# Patient Record
Sex: Female | Born: 1990
Health system: Southern US, Community
[De-identification: ages and names within clinical notes are randomized; demographics above are authoritative.]

## PROBLEM LIST (undated history)

## (undated) DIAGNOSIS — F419 Anxiety disorder, unspecified: Secondary | ICD-10-CM

## (undated) DIAGNOSIS — E063 Autoimmune thyroiditis: Secondary | ICD-10-CM

## (undated) DIAGNOSIS — E559 Vitamin D deficiency, unspecified: Secondary | ICD-10-CM

## (undated) DIAGNOSIS — F988 Other specified behavioral and emotional disorders with onset usually occurring in childhood and adolescence: Secondary | ICD-10-CM

## (undated) DIAGNOSIS — E038 Other specified hypothyroidism: Secondary | ICD-10-CM

## (undated) HISTORY — DX: Anxiety disorder, unspecified: F41.9

## (undated) HISTORY — DX: Vitamin D deficiency, unspecified: E55.9

## (undated) HISTORY — DX: Autoimmune thyroiditis: E06.3

## (undated) HISTORY — DX: Other specified behavioral and emotional disorders with onset usually occurring in childhood and adolescence: F98.8

## (undated) HISTORY — DX: Other specified hypothyroidism: E03.8

---
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
## 2014-10-14 LAB — HM PAP SMEAR: HM Pap smear: NEGATIVE

## 2015-02-17 ENCOUNTER — Other Ambulatory Visit: Payer: Self-pay

## 2015-02-17 MED ORDER — AMOXICILLIN-POT CLAVULANATE 875-125 MG PO TABS: 1.0000 | ORAL_TABLET | Freq: Two times a day (BID) | ORAL | Status: DC

## 2015-02-17 MED ORDER — AMOXICILLIN-POT CLAVULANATE 875-125 MG PO TABS
1.0000 | ORAL_TABLET | Freq: Two times a day (BID) | ORAL | Status: DC
Start: 2015-02-17 — End: 2015-06-28

## 2015-02-17 NOTE — Telephone Encounter (Signed)
OK to have abx- OK to send to her pharmacy when we know what her pharmacy is. Marland Kitchen

## 2015-02-17 NOTE — Telephone Encounter (Signed)
Patient called, she states that the swollen lymph nodes under her arm are not firm anymore, but they are still there. She now has swollen lymph nodes in her neck. She would like to know if she can have an antibiotic or if she needs to come back in for an appointment.

## 2015-02-17 NOTE — Telephone Encounter (Signed)
sent 

## 2015-02-17 NOTE — Telephone Encounter (Signed)
Called and notified patient that Dr.Johnson gave her an antibiotic. Will send this to provider so that she can send

## 2015-06-28 ENCOUNTER — Encounter: Payer: Self-pay | Admitting: Family Medicine

## 2015-06-28 ENCOUNTER — Ambulatory Visit (INDEPENDENT_AMBULATORY_CARE_PROVIDER_SITE_OTHER): Payer: Managed Care, Other (non HMO) | Admitting: Family Medicine

## 2015-06-28 ENCOUNTER — Other Ambulatory Visit: Payer: Self-pay | Admitting: Family Medicine

## 2015-06-28 VITALS — BP 124/84 | HR 68 | Temp 95.0°F | Ht 66.3 in | Wt 154.0 lb

## 2015-06-28 DIAGNOSIS — Z0000 Encounter for general adult medical examination without abnormal findings: Secondary | ICD-10-CM | POA: Diagnosis not present

## 2015-06-28 DIAGNOSIS — Z3041 Encounter for surveillance of contraceptive pills: Secondary | ICD-10-CM | POA: Diagnosis not present

## 2015-06-28 DIAGNOSIS — E039 Hypothyroidism, unspecified: Secondary | ICD-10-CM

## 2015-06-28 DIAGNOSIS — Z13220 Encounter for screening for lipoid disorders: Secondary | ICD-10-CM

## 2015-06-28 DIAGNOSIS — Z309 Encounter for contraceptive management, unspecified: Secondary | ICD-10-CM | POA: Insufficient documentation

## 2015-06-28 MED ORDER — NORGESTIMATE-ETH ESTRADIOL 0.25-35 MG-MCG PO TABS: 1.0000 | ORAL_TABLET | Freq: Every day | ORAL | Status: DC

## 2015-06-28 NOTE — Assessment & Plan Note (Signed)
Having symptoms since coming off her adderall. Checking thyroid hormones today. Will adjust dose as needed. Await results.

## 2015-06-28 NOTE — Assessment & Plan Note (Signed)
Doing well. Under good control. Pap up to date. Refill given today.

## 2015-06-28 NOTE — Progress Notes (Signed)
BP 124/84 mmHg  Pulse 68  Temp(Src) 95 F (35 C)  Ht 5' 6.3" (1.684 m)  Wt 154 lb (69.854 kg)  BMI 24.63 kg/m2  SpO2 96%  LMP 06/18/2015   Subjective:    Patient ID: Caitlin HuskEmily Molina, female    DOB: [birth year], 24 y.o.   MRN: [id number]  HPI: Caitlin Molina is a 24 y.o. female  Chief Complaint  Patient presents with  . Contraception    Needs a refill on her birth control  . Hypothyroidism    patient states that she has had some dizzy spells, she belives that her thyroid is off   HYPOTHYROIDISM- last had her thyroid checked in the spring, has been feeling dizzy and tired, hungry Thyroid control status:worse Satisfied with current treatment? no Medication side effects: no Medication compliance: excellent compliance Recent dose adjustment:no Fatigue: yes Cold intolerance: yes Heat intolerance: no Weight gain: yes Weight loss: no Constipation: yes Diarrhea/loose stools: no Palpitations: no Lower extremity edema: no Anxiety/depressed mood: yes  OCP is working well, no concerns about it. Doing well with no concerns.  Relevant past medical, surgical, family and social history reviewed and updated as indicated. Interim medical history since our last visit reviewed. Allergies and medications reviewed and updated.  Review of Systems  Constitutional: Negative.   Respiratory: Negative.   Cardiovascular: Negative.   Neurological: Negative.   Psychiatric/Behavioral: Negative.    Per HPI unless specifically indicated above     Objective:    BP 124/84 mmHg  Pulse 68  Temp(Src) 95 F (35 C)  Ht 5' 6.3" (1.684 m)  Wt 154 lb (69.854 kg)  BMI 24.63 kg/m2  SpO2 96%  LMP 06/18/2015  Wt Readings from Last 3 Encounters:  06/28/15 154 lb (69.854 kg)  02/03/15 146 lb (66.225 kg)    Physical Exam  Constitutional: She is oriented to person, place, and time. She appears well-developed and well-nourished. No distress.  HENT:  Head: Normocephalic and atraumatic.  Right Ear:  Hearing normal.  Left Ear: Hearing normal.  Nose: Nose normal.  Eyes: Conjunctivae, EOM and lids are normal. Pupils are equal, round, and reactive to light. Right eye exhibits no discharge. Left eye exhibits no discharge. No scleral icterus.  Neck: Normal range of motion. Neck supple. No JVD present. No tracheal deviation present. No thyromegaly present.  Cardiovascular: Normal rate, regular rhythm, normal heart sounds and intact distal pulses.  Exam reveals no gallop and no friction rub.   No murmur heard. Pulmonary/Chest: Effort normal and breath sounds normal. No stridor. No respiratory distress. She has no wheezes. She has no rales. She exhibits no tenderness.  Musculoskeletal: Normal range of motion.  Lymphadenopathy:    She has no cervical adenopathy.  Neurological: She is alert and oriented to person, place, and time.  Skin: Skin is warm, dry and intact. No rash noted. No erythema. No pallor.  Psychiatric: She has a normal mood and affect. Her speech is normal and behavior is normal. Judgment and thought content normal. Cognition and memory are normal.  Nursing note and vitals reviewed.  No results found for this or any previous visit.    Assessment & Plan:   Problem List Items Addressed This Visit      Endocrine   Thyroid activity decreased - Primary    Having symptoms since coming off her adderall. Checking thyroid hormones today. Will adjust dose as needed. Await results.         Other   Contraception management    Doing  well. Under good control. Pap up to date. Refill given today.           Follow up plan: Return Prior to Dec 3, for Physical/follow up thyroid.

## 2015-06-29 ENCOUNTER — Telehealth: Payer: Self-pay | Admitting: Family Medicine

## 2015-06-29 LAB — T3: T3, Total: 133 ng/dL (ref 71–180)

## 2015-06-29 LAB — T4: T4, Total: 8.4 ug/dL (ref 4.5–12.0)

## 2015-06-29 LAB — TSH: TSH: 1.55 u[IU]/mL (ref 0.450–4.500)

## 2015-06-29 NOTE — Telephone Encounter (Signed)
Called with normal results. Likely due to coming off her adderall. Checking other labs in 1 month.

## 2015-07-29 ENCOUNTER — Encounter: Payer: Self-pay | Admitting: Family Medicine

## 2015-08-08 ENCOUNTER — Encounter: Payer: Managed Care, Other (non HMO) | Admitting: Family Medicine

## 2017-03-28 ENCOUNTER — Encounter: Payer: Self-pay | Admitting: Primary Care

## 2017-03-28 ENCOUNTER — Ambulatory Visit (INDEPENDENT_AMBULATORY_CARE_PROVIDER_SITE_OTHER): Payer: 59 | Admitting: Primary Care

## 2017-03-28 VITALS — BP 122/78 | HR 76 | Temp 98.1°F | Ht 66.25 in | Wt 153.8 lb

## 2017-03-28 DIAGNOSIS — Z0000 Encounter for general adult medical examination without abnormal findings: Secondary | ICD-10-CM | POA: Diagnosis not present

## 2017-03-28 DIAGNOSIS — F909 Attention-deficit hyperactivity disorder, unspecified type: Secondary | ICD-10-CM | POA: Insufficient documentation

## 2017-03-28 DIAGNOSIS — Z113 Encounter for screening for infections with a predominantly sexual mode of transmission: Secondary | ICD-10-CM | POA: Diagnosis not present

## 2017-03-28 DIAGNOSIS — E039 Hypothyroidism, unspecified: Secondary | ICD-10-CM | POA: Diagnosis not present

## 2017-03-28 LAB — LIPID PANEL
CHOLESTEROL: 229 mg/dL — AB (ref 0–200)
HDL: 54.7 mg/dL (ref >39.00)
LDL CALC: 162 mg/dL — AB (ref 0–99)
NonHDL: 174.73
TRIGLYCERIDES: 64 mg/dL (ref 0.0–149.0)
Total CHOL/HDL Ratio: 4
VLDL: 12.8 mg/dL (ref 0.0–40.0)

## 2017-03-28 LAB — COMPREHENSIVE METABOLIC PANEL
ALBUMIN: 4.3 g/dL (ref 3.5–5.2)
ALT: 9 U/L (ref 0–35)
AST: 15 U/L (ref 0–37)
Alkaline Phosphatase: 56 U/L (ref 39–117)
BUN: 10 mg/dL (ref 6–23)
CALCIUM: 9.7 mg/dL (ref 8.4–10.5)
CHLORIDE: 102 meq/L (ref 96–112)
CO2: 27 mEq/L (ref 19–32)
CREATININE: 0.73 mg/dL (ref 0.40–1.20)
GFR: 104.5 mL/min (ref >60.00)
Glucose, Bld: 97 mg/dL (ref 70–99)
POTASSIUM: 3.8 meq/L (ref 3.5–5.1)
Sodium: 136 mEq/L (ref 135–145)
Total Bilirubin: 0.5 mg/dL (ref 0.2–1.2)
Total Protein: 7.3 g/dL (ref 6.0–8.3)

## 2017-03-28 LAB — TSH: TSH: 10.9 u[IU]/mL — ABNORMAL HIGH (ref 0.35–4.50)

## 2017-03-28 MED ORDER — LEVOTHYROXINE SODIUM 75 MCG PO TABS: ORAL_TABLET | ORAL | 1 refills | Status: DC

## 2017-03-28 NOTE — Patient Instructions (Addendum)
I sent a refill of your levothyroxine 75 mcg to your pharmacy.  Complete lab work prior to leaving today. I will notify you of your results once received.   Continue your efforts towards a healthy lifestyle through diet and exercise.  Ensure you are consuming 64 ounces of water daily.  Schedule a lab only appointment in 6 weeks to recheck your thyroid function.  It was a pleasure to meet you today! Please don't hesitate to call me with any questions. Welcome to [facility]!

## 2017-03-28 NOTE — Assessment & Plan Note (Signed)
Out of levothyroxine 75 mcg for 1 month. Check TSH today, sent refills for current levothyroxine dose to pharmacy. Will repeat TSH in 6 weeks.

## 2017-03-28 NOTE — Addendum Note (Signed)
Addended by: Alvina ChouWALSH, Kyley Laurel J on: 03/28/2017 09:01 AM   Modules accepted: Orders

## 2017-03-28 NOTE — Progress Notes (Signed)
Subjective:    Patient ID: Caitlin Molina, female    DOB: 1990/10/28, 26 y.o.   MRN: [id number]  HPI  Caitlin Molina is a 26 year old female who presents today to establish care, for complete physical, and discuss the problems mentioned below. Will obtain old records.  1) STD Testing: Diagnosed with genital warts on 03/20/17 per her GYN. They planned on treating but the treatment was out of stock at that time. She plans on returning sometime next week.   2) ADHD: Diagnosed at age 26. Currently managed on Adderall 20 mg BID. Currently following with Dr. Sharl MaKerr (psychiatry) every 6 months. She feels well managed on this medication.  3) Hypothyroidism: Currently managed on levothyroxine 75 mcg. Her last TSH was normal in January 2018. She ran out of her levothyroxine 75 mcg about 1 month ago as she couldn't get in for follow up.    Immunizations: -Tetanus: Completed in 2015 -Influenza: Completed last season   Diet: She endorses a healthy diet Breakfast: Protein shake Lunch: Protein shake Dinner: Meat, vegetables Snacks: Fruit, vegetables Desserts: Occasionally Beverages: Water, occasional coffee, occasional milk, alcohol 1-2 times weekly  Exercise: She works out at [employer] gym 30 minutes daily Eye exam: Due Dental exam: Completes annually Pap Smear: Endorses this is up-to-date. Following with GYN.   Review of Systems  Constitutional: Negative for unexpected weight change.  HENT: Negative for rhinorrhea.   Respiratory: Negative for cough and shortness of breath.   Cardiovascular: Negative for chest pain.  Gastrointestinal: Negative for constipation and diarrhea.  Genitourinary: Positive for genital sores. Negative for difficulty urinating, dysuria, frequency, menstrual problem, pelvic pain and vaginal discharge.  Musculoskeletal: Negative for arthralgias and myalgias.  Skin: Negative for rash.  Allergic/Immunologic: Negative for environmental allergies.  Neurological: Negative for  dizziness, numbness and headaches.  Psychiatric/Behavioral:       Feels well managed for ADHD per psychiatry.        Past Medical History:  Diagnosis Date  . ADD (attention deficit disorder)   . Anxiety   . Hypothyroidism due to Hashimoto's thyroiditis   . Vitamin D deficiency      Social History   Social History  . Marital status: Single    Spouse name: N/A  . Number of children: N/A  . Years of education: N/A   Occupational History  . Not on file.   Social History Main Topics  . Smoking status: Never Smoker  . Smokeless tobacco: Never Used  . Alcohol use Yes     Comment: Socially  . Drug use: No  . Sexual activity: Not Currently    Birth control/ protection: Pill   Other Topics Concern  . Not on file   Social History Narrative   Single.   No children.   Works for [employer].       Past Surgical History:  Procedure Laterality Date  . WISDOM TOOTH EXTRACTION  2013    Family History  Problem Relation Age of Onset  . Mental illness Sister   . Thyroid disease Sister   . Asthma Brother   . Heart attack Maternal Grandfather   . Thyroid disease Paternal Grandmother   . Hypertension Paternal Grandmother   . Breast cancer Paternal Aunt     No Known Allergies  Current Outpatient Prescriptions on File Prior to Visit  Medication Sig Dispense Refill  . norgestimate-ethinyl estradiol (SPRINTEC 28) 0.25-35 MG-MCG tablet Take 1 tablet by mouth daily. 1 Package 12   No current facility-administered medications  on file prior to visit.     BP 122/78   Pulse 76   Temp 98.1 F (36.7 C) (Oral)   Ht 5' 6.25" (1.683 m)   Wt 153 lb 12.8 oz (69.8 kg)   LMP 03/10/2017   SpO2 99%   BMI 24.64 kg/m    Objective:   Physical Exam  Constitutional: She is oriented to person, place, and time. She appears well-nourished.  HENT:  Right Ear: Tympanic membrane and ear canal normal.  Left Ear: Tympanic membrane and ear canal normal.  Nose: Nose normal.    Mouth/Throat: Oropharynx is clear and moist.  Eyes: Pupils are equal, round, and reactive to light. Conjunctivae and EOM are normal.  Neck: Neck supple. No thyromegaly present.  Cardiovascular: Normal rate and regular rhythm.   No murmur heard. Pulmonary/Chest: Effort normal and breath sounds normal. She has no rales.  Abdominal: Soft. Bowel sounds are normal. There is no tenderness.  Musculoskeletal: Normal range of motion.  Lymphadenopathy:    She has no cervical adenopathy.  Neurological: She is alert and oriented to person, place, and time. She has normal reflexes. No cranial nerve deficit.  Skin: Skin is warm and dry. No rash noted.  Psychiatric: She has a normal mood and affect.          Assessment & Plan:  STD Testing:  Diagnosed with genital warts per GYN on 03/20/17. Will screen for other STD's. She is asymptomatic.  Morrie Sheldon, NP

## 2017-03-28 NOTE — Assessment & Plan Note (Signed)
Immunizations UTD . Pap UTD per patient, following with GYN. Commended her on healthy diet and regular exercise. Exam unremarkable. Labs pending, including STD labs given recent diagnosis of genital warts. Follow up in 1 year for annual exam.

## 2017-03-28 NOTE — Assessment & Plan Note (Signed)
Following with psychiatry, feels well managed on Adderall 20 mg BID.

## 2017-03-29 LAB — HSV(HERPES SIMPLEX VRS) I + II AB-IGG

## 2017-03-29 LAB — GC/CHLAMYDIA PROBE AMP
CT Probe RNA: NOT DETECTED
GC Probe RNA: NOT DETECTED

## 2017-03-29 LAB — RPR

## 2017-03-29 LAB — HIV ANTIBODY (ROUTINE TESTING W REFLEX): HIV: NONREACTIVE

## 2017-04-01 LAB — HSV 1/2 AB (IGM), IFA W/RFLX TITER
HSV 1 IGM SCREEN: NEGATIVE
HSV 2 IgM Screen: NEGATIVE

## 2017-04-11 ENCOUNTER — Ambulatory Visit (INDEPENDENT_AMBULATORY_CARE_PROVIDER_SITE_OTHER): Payer: 59 | Admitting: Family

## 2017-04-11 ENCOUNTER — Ambulatory Visit (INDEPENDENT_AMBULATORY_CARE_PROVIDER_SITE_OTHER): Payer: 59

## 2017-04-11 ENCOUNTER — Encounter: Payer: Self-pay | Admitting: Family

## 2017-04-11 VITALS — BP 118/82 | HR 66 | Temp 98.3°F | Resp 16 | Ht 66.0 in | Wt 157.1 lb

## 2017-04-11 DIAGNOSIS — M79672 Pain in left foot: Secondary | ICD-10-CM | POA: Diagnosis not present

## 2017-04-11 IMAGING — DX DG FOOT COMPLETE 3+V*L*
3 series · 3 of 3 positions shown · non-contrast
Comparison: None.

CLINICAL DATA: Pain following fall

EXAM:
LEFT FOOT - COMPLETE 3+ VIEW

[foot ap]
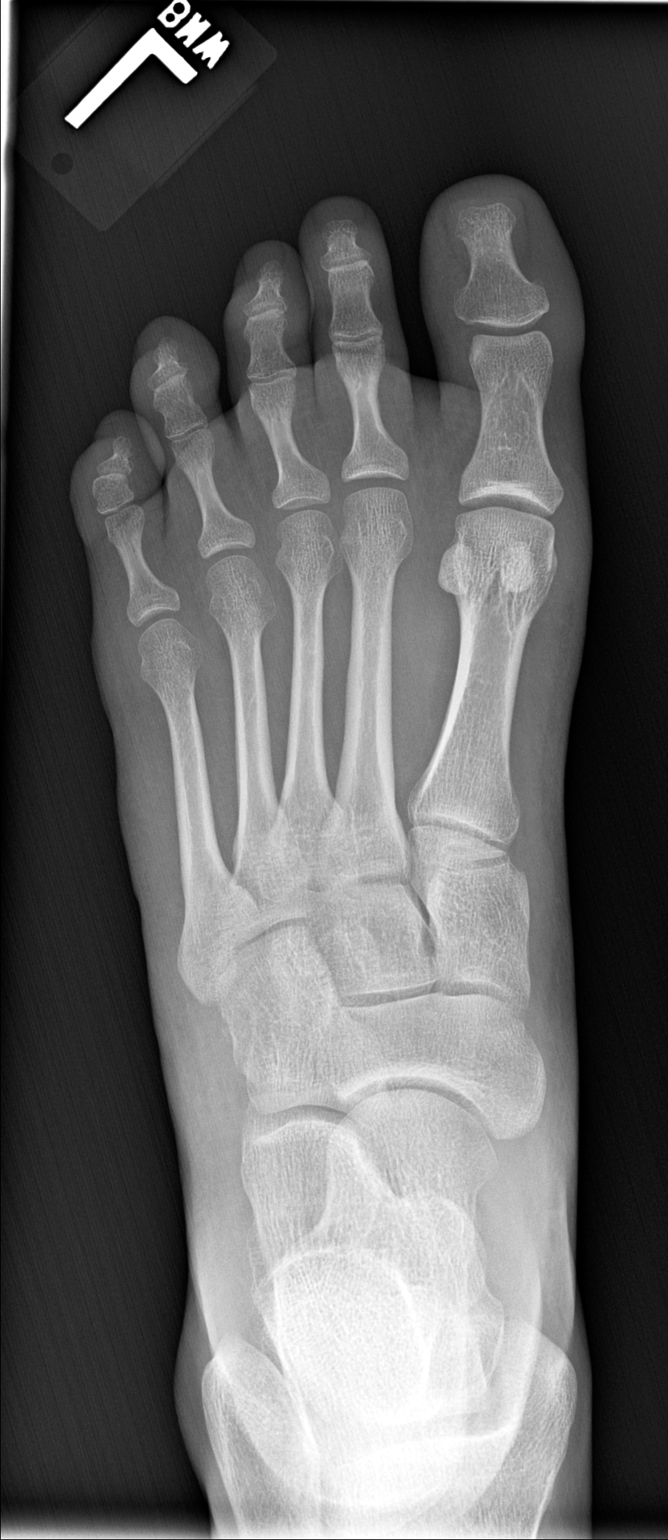

[foot obl (oblique)]
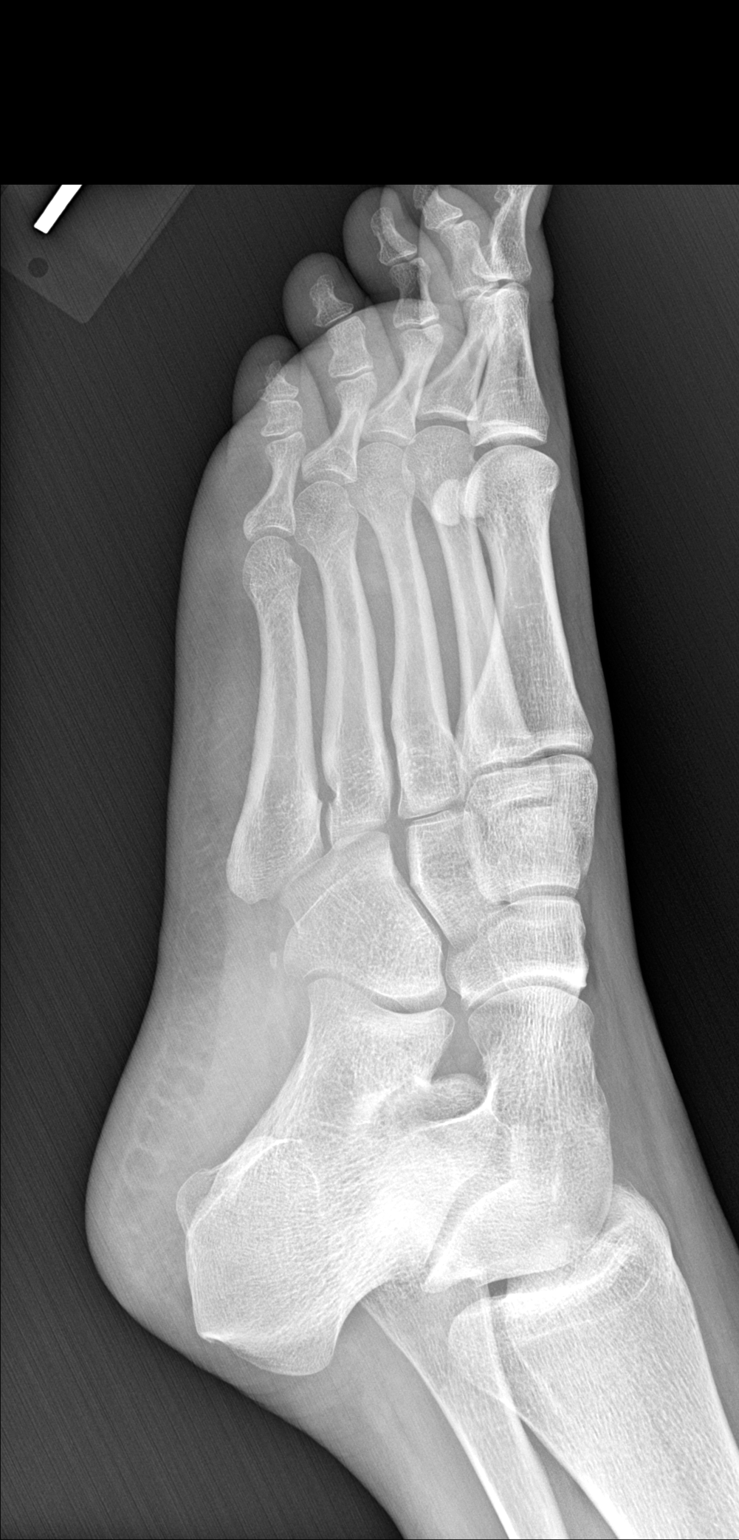

[foot lat]
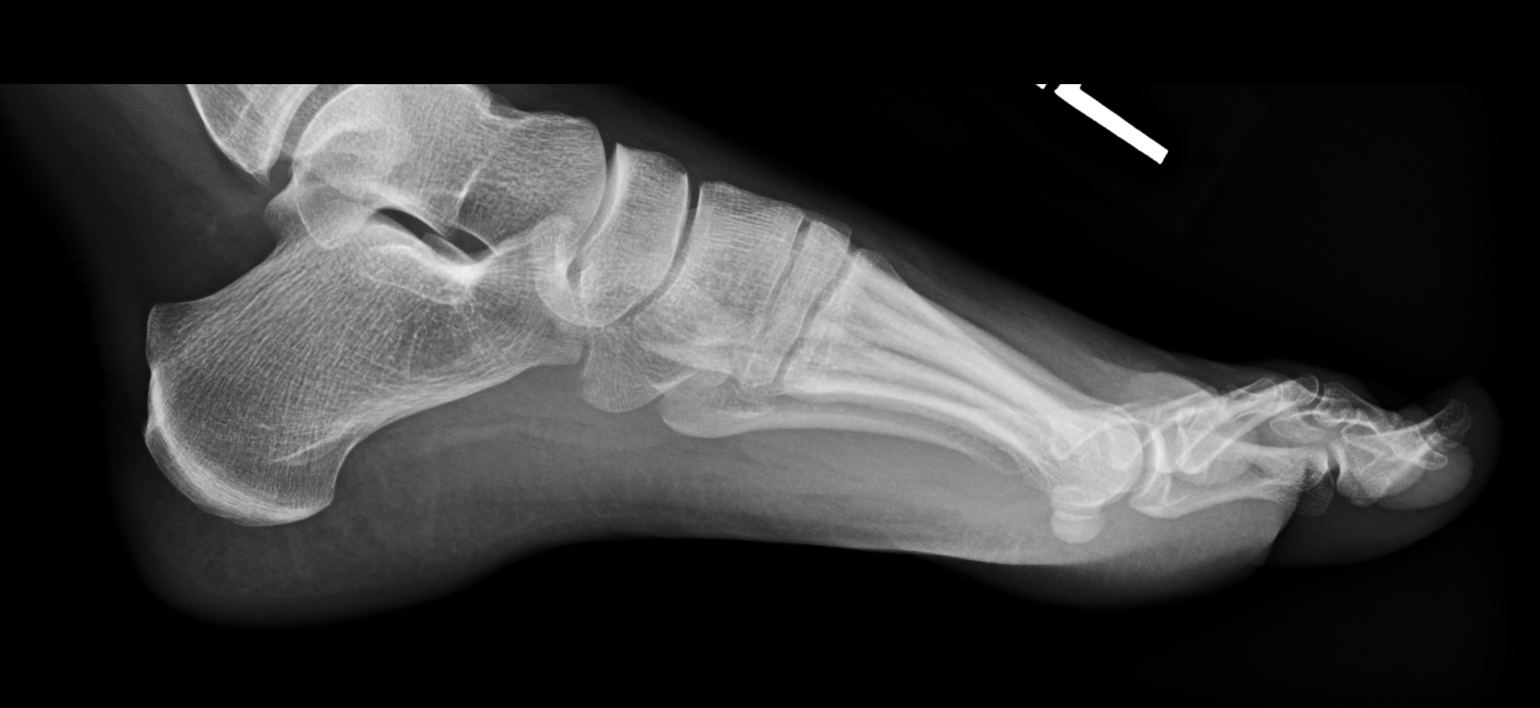

[3 of 3 positions shown; findings below may reference images not displayed]

FINDINGS: Frontal, oblique, and lateral views were obtained. There is no
fracture or spondylolisthesis. The disc spaces appear normal. No
erosive change.
IMPRESSION: No fracture or spondylolisthesis.  No evident arthropathy.

## 2017-04-11 NOTE — Patient Instructions (Signed)
Lets await xray  Continue ice, rest, compression   If not better, we will consult sports medicine  If there is no improvement in your symptoms, or if there is any worsening of symptoms, or if you have any additional concerns, please return for re-evaluation; or, if we are closed, consider going to the Emergency Room for evaluation if symptoms urgent.

## 2017-04-11 NOTE — Progress Notes (Signed)
Subjective:    Patient ID: Caitlin Molina, female    DOB: 02/08/1991, 26 y.o.   MRN: [id number]  CC: Caitlin Molina is a 26 y.o. female who presents today for an acute visit.    HPI: CC: left foot pain x 2 weeks, some improvement.  Onset after tripping on concrete. Didn't hit head or LOC.   Endorses swelling the day after and trouble bearing weight, which has improved. Able to put ' weight on it now.'   Ice, compression with some improvement.   No swelling in calf, sob No h/o dvt  Notes recent travel in airplane; on OCP  NO concerns for pregnancy.       HISTORY:  Past Medical History:  Diagnosis Date  . ADD (attention deficit disorder)   . Anxiety   . Hypothyroidism due to Hashimoto's thyroiditis   . Vitamin D deficiency    Past Surgical History:  Procedure Laterality Date  . WISDOM TOOTH EXTRACTION  2013   Family History  Problem Relation Age of Onset  . Mental illness Sister   . Thyroid disease Sister   . Asthma Brother   . Heart attack Maternal Grandfather   . Thyroid disease Paternal Grandmother   . Hypertension Paternal Grandmother   . Breast cancer Paternal Aunt     Allergies: Patient has no known allergies. Current Outpatient Prescriptions on File Prior to Visit  Medication Sig Dispense Refill  . amphetamine-dextroamphetamine (ADDERALL) 20 MG tablet Take 20 mg by mouth 2 (two) times daily.    Marland Kitchen. levothyroxine (SYNTHROID, LEVOTHROID) 75 MCG tablet Take 1 tablet by mouth every morning on an empty stomach with a full glass of water. 30 tablet 1  . norgestimate-ethinyl estradiol (SPRINTEC 28) 0.25-35 MG-MCG tablet Take 1 tablet by mouth daily. 1 Package 12   No current facility-administered medications on file prior to visit.     Social History  Substance Use Topics  . Smoking status: Never Smoker  . Smokeless tobacco: Never Used  . Alcohol use Yes     Comment: Socially    Review of Systems  Constitutional: Negative for chills and fever.    Respiratory: Negative for cough.   Cardiovascular: Negative for chest pain, palpitations and leg swelling.  Gastrointestinal: Negative for nausea and vomiting.  Musculoskeletal: Positive for joint swelling.      Objective:    BP 118/82 (BP Location: Left Arm, Patient Position: Sitting, Cuff Size: Normal)   Pulse 66   Temp 98.3 F (36.8 C) (Oral)   Resp 16   Ht 5\' 6"  (1.676 m)   Wt 157 lb 1.9 oz (71.3 kg)   LMP 04/04/2017   SpO2 99%   BMI 25.36 kg/m    Physical Exam  Constitutional: She appears well-developed and well-nourished.  Eyes: Conjunctivae are normal.  Cardiovascular: Normal rate, regular rhythm, normal heart sounds and normal pulses.   No LE edema, palpable cords or masses. No erythema or increased warmth. No asymmetry in calf size when compared bilaterally LE hair growth symmetric and present. No discoloration of varicosities noted. LE warm and palpable pedal pulses.    Pulmonary/Chest: Effort normal and breath sounds normal. She has no wheezes. She has no rhonchi. She has no rales.  Musculoskeletal:       Left ankle: She exhibits normal range of motion, no swelling and no ecchymosis. No tenderness.       Left foot: There is tenderness. There is normal range of motion, no bony tenderness, no swelling, no deformity and  no laceration.       Feet:  Area of tenderness as noted by patient. Mild tenderness with palpation.   Neurological: She is alert.  Skin: Skin is warm and dry.  Psychiatric: She has a normal mood and affect. Her speech is normal and behavior is normal. Thought content normal.  Vitals reviewed.      Assessment & Plan:   1. Left foot pain Mild tenderness with palpation, no gross deformity. Pending XR to ensure no fracture. Wells criteria for DVT low risk and no signs on exam. Advised if no fracture and she has no improvement with conservative treatment, we will consult orthopedics. Return precautions given.   - DG Foot Complete Left    I am  having Caitlin Molina maintain her norgestimate-ethinyl estradiol, amphetamine-dextroamphetamine, and levothyroxine.   No orders of the defined types were placed in this encounter.   Return precautions given.   Risks, benefits, and alternatives of the medications and treatment plan prescribed today were discussed, and patient expressed understanding.   Education regarding symptom management and diagnosis given to patient on AVS.  Continue to follow with Chestine Sporelark, Keane ScrapeKatherine K, NP for routine health maintenance.   Caitlin Molina and I agreed with plan.   Rennie PlowmanMargaret Syndey Jaskolski, FNP

## 2017-04-12 ENCOUNTER — Telehealth: Payer: Self-pay | Admitting: Primary Care

## 2017-04-12 NOTE — Telephone Encounter (Signed)
Pt called requesting xray results. Please advise, thank you!  Call pt @ [phone]

## 2017-04-12 NOTE — Telephone Encounter (Signed)
Patient was informed of normal Xray results.

## 2017-04-14 ENCOUNTER — Encounter: Payer: Self-pay | Admitting: Family

## 2017-04-18 ENCOUNTER — Telehealth: Payer: Self-pay | Admitting: Primary Care

## 2017-04-18 NOTE — Telephone Encounter (Signed)
Patient Name: Caitlin HuskMILY Wilinski  DOB: [birth year]    Initial Comment caller states recently when she coughs it feels like she is gagging on her tongue. Her Throat feels dry.    Nurse Assessment  Nurse: Nicanor Bakeosta, RN, Marylene LandAngela Date/Time (Eastern Time): 04/18/2017 3:52:12 PM  Confirm and document reason for call. If symptomatic, describe symptoms. ---Caller got a head cold on Monday. Mucous is thick but not as bad as it was on Monday. She has been hydrating the past 2 days but yesterday she started to feel like she is gagging on her tongue after coughing or sometimes after she swallows. She states the tongue does not feel different.  Does the patient have any new or worsening symptoms? ---Yes  Will a triage be completed? ---Yes  Related visit to physician within the last 2 weeks? ---No  Does the PT have any chronic conditions? (i.e. diabetes, asthma, etc.) ---No  Is the patient pregnant or possibly pregnant? (Ask all females between the ages of 3212-55) ---No  Is this a behavioral health or substance abuse call? ---No     Guidelines    Guideline Title Affirmed Question Affirmed Notes  Sinus Pain or Congestion Lots of coughing    Final Disposition User   See PCP When Office is Open (within 3 days) [place], RN, [employer]    Referrals  REFERRED TO PCP OFFICE   Disagree/Comply: Danella Maiersomply

## 2017-04-18 NOTE — Telephone Encounter (Signed)
Pt has appt to see Jae DireKate on 04/19/17 at 10:45.

## 2017-04-19 ENCOUNTER — Encounter: Payer: Self-pay | Admitting: Primary Care

## 2017-04-19 ENCOUNTER — Ambulatory Visit (INDEPENDENT_AMBULATORY_CARE_PROVIDER_SITE_OTHER): Payer: 59 | Admitting: Primary Care

## 2017-04-19 VITALS — BP 116/70 | HR 81 | Temp 98.1°F | Ht 66.25 in | Wt 153.4 lb

## 2017-04-19 DIAGNOSIS — J302 Other seasonal allergic rhinitis: Secondary | ICD-10-CM

## 2017-04-19 NOTE — Patient Instructions (Signed)
Start an antihistamine such as Allegra/Claritin/Zyrtec. Take this daily.  Refrain from using Mucinex. Send me a message through My Chart with the type you took.  Continue Ibuprofen as needed.  Nasal Congestion/Sinus Pressure: Try using Flonase (fluticasone) nasal spray. Instill 1 spray in each nostril twice daily.   It was a pleasure to see you today!

## 2017-04-19 NOTE — Progress Notes (Signed)
Subjective:    Patient ID: Caitlin Molina, female    DOB: 07-Jan-1991, 26 y.o.   MRN: [id number]  HPI  Ms. Soden is a 26 year old female who presents today with a chief complaint of cough. She also reports sore throat, nasal congestion. Her symptoms began four days ago. Yesterday she noticed her tongue swelling and was "gagging". She's taken 2 doses of Mucinex. She took a 600 mg Ibuprofen yesterday with reduction in tongue swelling. She denies changes in medications or food.   Review of Systems  Constitutional: Negative for chills, fatigue and fever.  HENT: Positive for congestion, postnasal drip and sore throat. Negative for trouble swallowing.   Respiratory: Positive for cough. Negative for shortness of breath and wheezing.   Cardiovascular: Negative for chest pain.  Neurological: Negative for headaches.       Past Medical History:  Diagnosis Date  . ADD (attention deficit disorder)   . Anxiety   . Hypothyroidism due to Hashimoto's thyroiditis   . Vitamin D deficiency      Social History   Social History  . Marital status: Single    Spouse name: N/A  . Number of children: N/A  . Years of education: N/A   Occupational History  . Not on file.   Social History Main Topics  . Smoking status: Never Smoker  . Smokeless tobacco: Never Used  . Alcohol use Yes     Comment: Socially  . Drug use: No  . Sexual activity: Not Currently    Birth control/ protection: Pill   Other Topics Concern  . Not on file   Social History Narrative   Single.   No children.   Works for [employer].       Past Surgical History:  Procedure Laterality Date  . WISDOM TOOTH EXTRACTION  2013    Family History  Problem Relation Age of Onset  . Mental illness Sister   . Thyroid disease Sister   . Asthma Brother   . Heart attack Maternal Grandfather   . Thyroid disease Paternal Grandmother   . Hypertension Paternal Grandmother   . Breast cancer Paternal Aunt     No Known  Allergies  Current Outpatient Prescriptions on File Prior to Visit  Medication Sig Dispense Refill  . levothyroxine (SYNTHROID, LEVOTHROID) 75 MCG tablet Take 1 tablet by mouth every morning on an empty stomach with a full glass of water. 30 tablet 1  . norgestimate-ethinyl estradiol (SPRINTEC 28) 0.25-35 MG-MCG tablet Take 1 tablet by mouth daily. 1 Package 12   No current facility-administered medications on file prior to visit.     BP 116/70   Pulse 81   Temp 98.1 F (36.7 C) (Oral)   Ht 5' 6.25" (1.683 m)   Wt 153 lb 6.4 oz (69.6 kg)   LMP 04/04/2017   SpO2 99%   BMI 24.57 kg/m    Objective:   Physical Exam  Constitutional: She appears well-nourished.  HENT:  Right Ear: Tympanic membrane and ear canal normal.  Left Ear: Tympanic membrane and ear canal normal.  Nose: Mucosal edema present. Right sinus exhibits no maxillary sinus tenderness and no frontal sinus tenderness. Left sinus exhibits no maxillary sinus tenderness and no frontal sinus tenderness.  Mouth/Throat: Oropharynx is clear and moist.  Tongue did not appear swollen  Eyes: Conjunctivae are normal.  Neck: Neck supple.  Cardiovascular: Normal rate and regular rhythm.   Pulmonary/Chest: Effort normal and breath sounds normal. She has no wheezes. She has  no rales.  Lymphadenopathy:    She has no cervical adenopathy.  Skin: Skin is warm and dry.          Assessment & Plan:  Allergic Rhinitis:  Nasal congestion, rhinorrhea, post nasal drip x 4 days. Exam today unremarkable.  Tongue swelling could be secondary to Mucinex as this is the only new medication she's taken. She will send us a message through My Chart with the specific type of Mucinex she took. Start Zyrtec daily for allergies and any other tongue swelling. Continue Ibuprofen PRN. Hold OTC cough suppressants until we know which Mucinex she took OTC. Stop Mucinex.  Morrie Sheldonlark,Byron Tipping Kendal, NP

## 2017-04-19 NOTE — Telephone Encounter (Signed)
Noted  

## 2017-05-02 ENCOUNTER — Other Ambulatory Visit: Payer: Self-pay | Admitting: Primary Care

## 2017-05-02 DIAGNOSIS — E039 Hypothyroidism, unspecified: Secondary | ICD-10-CM

## 2017-05-09 ENCOUNTER — Other Ambulatory Visit: Payer: 59

## 2017-05-15 ENCOUNTER — Other Ambulatory Visit: Payer: 59

## 2017-05-17 ENCOUNTER — Other Ambulatory Visit: Payer: Self-pay | Admitting: Primary Care

## 2017-05-20 ENCOUNTER — Other Ambulatory Visit: Payer: Self-pay

## 2017-06-03 ENCOUNTER — Other Ambulatory Visit: Payer: Self-pay | Admitting: Primary Care

## 2017-06-03 DIAGNOSIS — E039 Hypothyroidism, unspecified: Secondary | ICD-10-CM

## 2017-08-10 ENCOUNTER — Other Ambulatory Visit: Payer: Self-pay | Admitting: Primary Care

## 2017-08-10 DIAGNOSIS — E039 Hypothyroidism, unspecified: Secondary | ICD-10-CM

## 2017-09-05 DIAGNOSIS — Z3A13 13 weeks gestation of pregnancy: Secondary | ICD-10-CM | POA: Diagnosis not present

## 2017-09-05 DIAGNOSIS — O283 Abnormal ultrasonic finding on antenatal screening of mother: Secondary | ICD-10-CM | POA: Diagnosis not present

## 2017-09-11 ENCOUNTER — Other Ambulatory Visit: Payer: Self-pay | Admitting: Primary Care

## 2017-09-11 DIAGNOSIS — E039 Hypothyroidism, unspecified: Secondary | ICD-10-CM

## 2017-09-12 DIAGNOSIS — Z3492 Encounter for supervision of normal pregnancy, unspecified, second trimester: Secondary | ICD-10-CM | POA: Insufficient documentation

## 2017-09-14 DIAGNOSIS — O161 Unspecified maternal hypertension, first trimester: Secondary | ICD-10-CM | POA: Insufficient documentation

## 2017-10-17 ENCOUNTER — Encounter (HOSPITAL_COMMUNITY): Payer: Self-pay

## 2018-01-28 ENCOUNTER — Telehealth: Payer: Self-pay | Admitting: Primary Care

## 2018-01-28 NOTE — Telephone Encounter (Signed)
Okay to schedule at her convenience 

## 2018-01-28 NOTE — Telephone Encounter (Signed)
Copied from CRM [id number]. Topic: Appointment Scheduling - Scheduling Inquiry for Clinic >> Jan 28, 2018  3:55 PM Landry Mellow wrote: Reason for CRM: pt would like to have tdap booster  Cb is [phone] Pt is [redacted] weeks pregnant

## 2018-02-05 ENCOUNTER — Ambulatory Visit: Payer: Self-pay

## 2018-02-06 ENCOUNTER — Ambulatory Visit (INDEPENDENT_AMBULATORY_CARE_PROVIDER_SITE_OTHER): Payer: Medicaid Other

## 2018-02-06 DIAGNOSIS — Z23 Encounter for immunization: Secondary | ICD-10-CM

## 2018-03-04 ENCOUNTER — Other Ambulatory Visit (HOSPITAL_COMMUNITY): Payer: Self-pay

## 2018-03-04 DIAGNOSIS — Z3A40 40 weeks gestation of pregnancy: Secondary | ICD-10-CM

## 2018-03-06 ENCOUNTER — Encounter (HOSPITAL_COMMUNITY): Payer: Self-pay | Admitting: *Deleted

## 2018-03-10 ENCOUNTER — Other Ambulatory Visit: Payer: Self-pay

## 2018-03-10 ENCOUNTER — Other Ambulatory Visit (HOSPITAL_COMMUNITY): Payer: Self-pay

## 2018-03-10 ENCOUNTER — Encounter (HOSPITAL_COMMUNITY): Payer: Self-pay

## 2018-03-10 ENCOUNTER — Ambulatory Visit (HOSPITAL_COMMUNITY)
Admission: RE | Admit: 2018-03-10 | Discharge: 2018-03-10 | Disposition: A | Discharge status: Home / Self Care | Payer: Medicaid Other | Source: Ambulatory Visit

## 2018-03-10 DIAGNOSIS — E039 Hypothyroidism, unspecified: Secondary | ICD-10-CM

## 2018-03-10 DIAGNOSIS — Z3A40 40 weeks gestation of pregnancy: Secondary | ICD-10-CM | POA: Diagnosis not present

## 2018-03-10 DIAGNOSIS — Z363 Encounter for antenatal screening for malformations: Secondary | ICD-10-CM | POA: Insufficient documentation

## 2018-03-10 DIAGNOSIS — Z3A39 39 weeks gestation of pregnancy: Secondary | ICD-10-CM | POA: Diagnosis not present

## 2018-03-10 DIAGNOSIS — O365930 Maternal care for other known or suspected poor fetal growth, third trimester, not applicable or unspecified: Secondary | ICD-10-CM | POA: Insufficient documentation

## 2018-03-10 DIAGNOSIS — O26843 Uterine size-date discrepancy, third trimester: Secondary | ICD-10-CM | POA: Diagnosis not present

## 2018-03-10 DIAGNOSIS — O99283 Endocrine, nutritional and metabolic diseases complicating pregnancy, third trimester: Secondary | ICD-10-CM | POA: Diagnosis not present

## 2018-03-11 DIAGNOSIS — Z3483 Encounter for supervision of other normal pregnancy, third trimester: Secondary | ICD-10-CM | POA: Diagnosis not present

## 2018-03-11 DIAGNOSIS — Z3A39 39 weeks gestation of pregnancy: Secondary | ICD-10-CM | POA: Diagnosis not present

## 2018-03-12 ENCOUNTER — Inpatient Hospital Stay (HOSPITAL_COMMUNITY): Admit: 2018-03-12 | Payer: Self-pay

## 2018-03-16 DIAGNOSIS — O9279 Other disorders of lactation: Secondary | ICD-10-CM | POA: Diagnosis not present

## 2018-03-19 DIAGNOSIS — O9279 Other disorders of lactation: Secondary | ICD-10-CM | POA: Diagnosis not present

## 2018-03-24 ENCOUNTER — Encounter: Payer: Self-pay | Admitting: Family Medicine

## 2018-03-24 ENCOUNTER — Ambulatory Visit: Payer: Medicaid Other | Admitting: Family Medicine

## 2018-03-24 VITALS — BP 108/76 | HR 74 | Temp 97.9°F | Ht 66.25 in | Wt 178.5 lb

## 2018-03-24 DIAGNOSIS — N6311 Unspecified lump in the right breast, upper outer quadrant: Secondary | ICD-10-CM

## 2018-03-24 DIAGNOSIS — N6310 Unspecified lump in the right breast, unspecified quadrant: Secondary | ICD-10-CM

## 2018-03-24 NOTE — Progress Notes (Signed)
   Subjective:    Patient ID: Caitlin Molina, female    DOB: 1991/06/03, 27 y.o.   MRN: [id number]  HPI This is a 27 yo female, accompanied by her mother, who presents today with right breast mass that she noticed about a week ago, noticed it after her milk came in. Not painful unless she is engorged, no fevers. Saw her midwife who thought it was likely a cyst, but not related to lactation. No family history of breast cancer.  She had a baby girl 9 days ago. Has been doing well. Breast feeding going well.   Past Medical History:  Diagnosis Date  . ADD (attention deficit disorder)   . Anxiety   . Hypothyroidism due to Hashimoto's thyroiditis   . Vitamin D deficiency    Past Surgical History:  Procedure Laterality Date  . WISDOM TOOTH EXTRACTION  2013   Family History  Problem Relation Age of Onset  . Mental illness Sister   . Thyroid disease Sister   . Asthma Brother   . Heart attack Maternal Grandfather   . Thyroid disease Paternal Grandmother   . Hypertension Paternal Grandmother   . Breast cancer Paternal Aunt    Social History   Tobacco Use  . Smoking status: Never Smoker  . Smokeless tobacco: Never Used  Substance Use Topics  . Alcohol use: Yes    Comment: Socially  . Drug use: No      Review of Systems Per HPI    Objective:   Physical Exam  Constitutional: She appears well-developed and well-nourished. No distress.  HENT:  Head: Normocephalic and atraumatic.  Eyes: Conjunctivae are normal.  Cardiovascular: Normal rate.  Pulmonary/Chest: Effort normal. Right breast exhibits no inverted nipple, no skin change and no tenderness. Left breast exhibits no inverted nipple, no mass, no skin change and no tenderness.    Neurological: She is alert.  Skin: Skin is warm and dry. She is not diaphoretic.  Psychiatric: She has a normal mood and affect. Her behavior is normal. Judgment and thought content normal.  Vitals reviewed.     BP 108/76 (BP Location: Left  Arm, Patient Position: Sitting, Cuff Size: Normal)   Pulse 74   Temp 97.9 F (36.6 C) (Oral)   Ht 5' 6.25" (1.683 m)   Wt 178 lb 8 oz (81 kg)   LMP 06/05/2017   SpO2 98%   Breastfeeding? Yes   BMI 28.59 kg/m      Assessment & Plan:  1. Breast mass, right - low suspicion for worrisome lesion, but will obtain US - US BREAST LTD UNI RIGHT INC AXILLA; Future   Olean Reeeborah Xylia Scherger, FNP-BC  [facility]  03/24/2018 11:22 AM

## 2018-03-24 NOTE — Patient Instructions (Signed)
Good to see you today  Please stop at the front desk to schedule your ultrasound

## 2018-03-25 ENCOUNTER — Ambulatory Visit
Admission: RE | Admit: 2018-03-25 | Discharge: 2018-03-25 | Disposition: A | Discharge status: Home / Self Care | Payer: Medicaid Other | Source: Ambulatory Visit | Attending: Family Medicine | Admitting: Family Medicine

## 2018-03-25 DIAGNOSIS — N6311 Unspecified lump in the right breast, upper outer quadrant: Secondary | ICD-10-CM | POA: Diagnosis present

## 2018-03-25 DIAGNOSIS — N6310 Unspecified lump in the right breast, unspecified quadrant: Secondary | ICD-10-CM | POA: Diagnosis not present

## 2018-03-25 DIAGNOSIS — N6001 Solitary cyst of right breast: Secondary | ICD-10-CM | POA: Diagnosis not present

## 2018-03-25 DIAGNOSIS — N6011 Diffuse cystic mastopathy of right breast: Secondary | ICD-10-CM | POA: Diagnosis not present

## 2018-04-10 DIAGNOSIS — O9279 Other disorders of lactation: Secondary | ICD-10-CM | POA: Diagnosis not present

## 2018-10-25 DIAGNOSIS — S46001A Unspecified injury of muscle(s) and tendon(s) of the rotator cuff of right shoulder, initial encounter: Secondary | ICD-10-CM | POA: Diagnosis not present

## 2018-10-25 DIAGNOSIS — M62838 Other muscle spasm: Secondary | ICD-10-CM | POA: Diagnosis not present

## 2018-10-25 DIAGNOSIS — M25511 Pain in right shoulder: Secondary | ICD-10-CM | POA: Diagnosis not present

## 2018-10-30 DIAGNOSIS — S161XXA Strain of muscle, fascia and tendon at neck level, initial encounter: Secondary | ICD-10-CM | POA: Insufficient documentation

## 2019-11-19 ENCOUNTER — Other Ambulatory Visit: Payer: Medicaid Other

## 2019-11-19 ENCOUNTER — Encounter: Payer: Self-pay | Admitting: Family Medicine

## 2019-11-19 ENCOUNTER — Ambulatory Visit (INDEPENDENT_AMBULATORY_CARE_PROVIDER_SITE_OTHER): Payer: Medicaid Other | Admitting: Family Medicine

## 2019-11-19 DIAGNOSIS — J069 Acute upper respiratory infection, unspecified: Secondary | ICD-10-CM

## 2019-11-19 DIAGNOSIS — Z20828 Contact with and (suspected) exposure to other viral communicable diseases: Secondary | ICD-10-CM | POA: Diagnosis not present

## 2019-11-19 MED ORDER — BENZONATATE 200 MG PO CAPS: 200.0000 mg | ORAL_CAPSULE | Freq: Three times a day (TID) | ORAL | 0 refills | Status: DC | PRN

## 2019-11-19 NOTE — Progress Notes (Signed)
Virtual Visit via Video Note  I connected with Caitlin Molina on 11/19/19 at  9:00 AM EST by a video enabled telemedicine application and verified that I am speaking with the correct person using two identifiers.  Location: Patient: working from home  Provider: office   I discussed the limitations of evaluation and management by telemedicine and the availability of in person appointments. The patient expressed understanding and agreed to proceed.  Parties involved in encounter  Patient: Caitlin Molina  Provider:  Loura Pardon MD    History of Present Illness: 29 yo pt of NP Caitlin Molina presents with cough and congestion   She has been congested since Sunday  Started with a mild ST and some pnd  This became worse  Then started cough a few days ago - getting worse   No fever  Headache off/on -- sinus/face with pressure  Nasal d/c is clear so far  Ears do not hurt   Non productive cough  occ little wheeze -mild  occ very mildly sob- with cough spell   No loss of taste or smell   A little nausea w/o vomiting  No diarrhea   No known exp to covid 19 that she knows of   Otc: Taking zicam cold remedy  Has taken nyquil at night - it helps a little   Daughter was sick lately - had a fever/ then cough  Rapid test was negative   Patient Active Problem List   Diagnosis Date Noted  . Viral URI with cough 11/19/2019  . Elevated blood pressure affecting pregnancy in first trimester, antepartum 09/14/2017  . Preventative health care 03/28/2017  . ADHD 03/28/2017  . Hypothyroidism 06/28/2015  . Contraception management 06/28/2015   Past Medical History:  Diagnosis Date  . ADD (attention deficit disorder)   . Anxiety   . Hypothyroidism due to Hashimoto's thyroiditis   . Vitamin D deficiency    Past Surgical History:  Procedure Laterality Date  . WISDOM TOOTH EXTRACTION  2013   Social History   Tobacco Use  . Smoking status: Never Smoker  . Smokeless tobacco: Never Used   Substance Use Topics  . Alcohol use: Yes    Comment: Socially  . Drug use: No   Family History  Problem Relation Age of Onset  . Mental illness Sister   . Thyroid disease Sister   . Asthma Brother   . Heart attack Maternal Grandfather   . Thyroid disease Paternal Grandmother   . Hypertension Paternal Grandmother   . Breast cancer Paternal Aunt    Allergies  Allergen Reactions  . Ciprofloxacin     Family allergy - pt never exposed  . Levofloxacin     Family allergies   Current Outpatient Medications on File Prior to Visit  Medication Sig Dispense Refill  . amphetamine-dextroamphetamine (ADDERALL) 30 MG tablet Take 1 tablet by mouth 2 (two) times daily.  0   No current facility-administered medications on file prior to visit.     Review of Systems  Constitutional: Negative for chills, fever and malaise/fatigue.  HENT: Positive for congestion. Negative for ear pain, sinus pain and sore throat.   Eyes: Negative for blurred vision, discharge and redness.  Respiratory: Positive for cough and wheezing. Negative for sputum production, shortness of breath and stridor.   Cardiovascular: Negative for chest pain, palpitations and leg swelling.  Gastrointestinal: Negative for abdominal pain, diarrhea, nausea and vomiting.  Musculoskeletal: Negative for myalgias.  Skin: Negative for rash.  Neurological: Positive for headaches. Negative  for dizziness.      Observations/Objective: Patient appears well, in no distress Weight is baseline  No facial swelling or asymmetry Mildly hoarse voice  Sounds congested/nasal  No obvious tremor or mobility impairment Moving neck and UEs normally Able to hear the call well  No cough or shortness of breath during interview  Talkative and mentally sharp with no cognitive changes No skin changes on face or neck , no rash or pallor Affect is normal    Assessment and Plan: Problem List Items Addressed This Visit      Respiratory   Viral URI  with cough    With congestion and cough and headache  Adv to get tested for covid and info given [facility]  inst her to call us with results  Adv sympt care at home (delsym/ expectorant/nasal saline/tylenol prn)  Tessalon send for cough as well inst to update if symptoms worsen (go to ER if severe) and if she develops new symptoms like fever            Follow Up Instructions: Drink fluids and rest  Get tested for covid, call us when you get a result and isolate until then  For cough- you can try delsym I also sent in tessalon that you can take 3 times daily   An expectorant may help congestion along with nasal saline spray  Tylenol for fever or headache  For night time-nyquil is ok if helpful/ do not mix with other over the counter medications Update if not starting to improve in a week or if worsening  Also if you develop new symptoms like fever  If severe symptoms-please go to the ER   I discussed the assessment and treatment plan with the patient. The patient was provided an opportunity to ask questions and all were answered. The patient agreed with the plan and demonstrated an understanding of the instructions.   The patient was advised to call back or seek an in-person evaluation if the symptoms worsen or if the condition fails to improve as anticipated.     Roxy Manns, MD

## 2019-11-19 NOTE — Patient Instructions (Addendum)
Drink fluids and rest  Get tested for covid, call us when you get a result and isolate until then  For cough- you can try delsym I also sent in tessalon that you can take 3 times daily   An expectorant may help congestion along with nasal saline spray  Tylenol for fever or headache  For night time-nyquil is ok if helpful/ do not mix with other over the counter medications Update if not starting to improve in a week or if worsening  Also if you develop new symptoms like fever  If severe symptoms-please go to the ER

## 2019-11-19 NOTE — Assessment & Plan Note (Addendum)
With congestion and cough and headache  Adv to get tested for covid and info given to [facility] tested [facility]  inst her to call us with results  Adv sympt care at home (delsym/ expectorant/nasal saline/tylenol prn)  Tessalon send for cough as well inst to update if symptoms worsen (go to ER if severe) and if she develops new symptoms like fever

## 2019-12-17 DIAGNOSIS — Z03818 Encounter for observation for suspected exposure to other biological agents ruled out: Secondary | ICD-10-CM | POA: Diagnosis not present

## 2019-12-17 DIAGNOSIS — Z20828 Contact with and (suspected) exposure to other viral communicable diseases: Secondary | ICD-10-CM | POA: Diagnosis not present

## 2020-04-12 ENCOUNTER — Other Ambulatory Visit: Payer: Self-pay

## 2020-04-12 ENCOUNTER — Ambulatory Visit (INDEPENDENT_AMBULATORY_CARE_PROVIDER_SITE_OTHER)
Admission: RE | Admit: 2020-04-12 | Discharge: 2020-04-12 | Disposition: A | Discharge status: Home / Self Care | Payer: Medicaid Other | Source: Ambulatory Visit | Attending: Family Medicine | Admitting: Family Medicine

## 2020-04-12 ENCOUNTER — Encounter: Payer: Self-pay | Admitting: Family Medicine

## 2020-04-12 ENCOUNTER — Ambulatory Visit (INDEPENDENT_AMBULATORY_CARE_PROVIDER_SITE_OTHER): Payer: Medicaid Other | Admitting: Family Medicine

## 2020-04-12 DIAGNOSIS — S92919A Unspecified fracture of unspecified toe(s), initial encounter for closed fracture: Secondary | ICD-10-CM | POA: Insufficient documentation

## 2020-04-12 DIAGNOSIS — M79671 Pain in right foot: Secondary | ICD-10-CM | POA: Diagnosis not present

## 2020-04-12 DIAGNOSIS — S92514A Nondisplaced fracture of proximal phalanx of right lesser toe(s), initial encounter for closed fracture: Secondary | ICD-10-CM | POA: Diagnosis not present

## 2020-04-12 NOTE — Progress Notes (Signed)
Subjective:    Patient ID: Caitlin Molina, female    DOB: 1991-05-04, 29 y.o.   MRN: [id number]  This visit occurred during the SARS-CoV-2 public health emergency.  Safety protocols were in place, including screening questions prior to the visit, additional usage of staff PPE, and extensive cleaning of exam room while observing appropriate contact time as indicated for disinfecting solutions.    HPI 29 yo pt of NP Clark presents with R foot injury   Pt injured her foot after surfing  Got out of the water and hit her foot (pinkie toe) on the surfboard Hurt a lot  Jammed it? Possibly  Started swelling an hour later and more painful  It did turn black and blue as well   Hard to walk on that foot  She wore open toe shoes to avoid pressure on the toe  Some improvement now but not at 100%   She used ice on it  Did not take any otc pain medication    Rest of foot feels ok   Has a 27 year old- cannot run after her    DG Foot Complete Right  Result Date: 04/12/2020 CLINICAL DATA:  29 year old female with trauma to the right foot. EXAM: RIGHT FOOT COMPLETE - 3+ VIEW COMPARISON:  None. FINDINGS: There is a nondisplaced fracture of the proximal phalanx of the fifth digit. No other acute fracture. There is no dislocation. The bones are well mineralized. No arthritic changes. The soft tissues are grossly unremarkable. IMPRESSION: Nondisplaced fracture of the proximal phalanx of the fifth digit. Electronically Signed   By: Elgie Collard M.D.   On: 04/12/2020 17:17   Patient Active Problem List   Diagnosis Date Noted  . Right foot pain 04/12/2020  . Elevated blood pressure affecting pregnancy in first trimester, antepartum 09/14/2017  . Preventative health care 03/28/2017  . ADHD 03/28/2017  . Hypothyroidism 06/28/2015  . Contraception management 06/28/2015   Past Medical History:  Diagnosis Date  . ADD (attention deficit disorder)   . Anxiety   . Hypothyroidism due to Hashimoto's  thyroiditis   . Vitamin D deficiency    Past Surgical History:  Procedure Laterality Date  . WISDOM TOOTH EXTRACTION  2013   Social History   Tobacco Use  . Smoking status: Never Smoker  . Smokeless tobacco: Never Used  Vaping Use  . Vaping Use: Never used  Substance Use Topics  . Alcohol use: Yes    Comment: Socially  . Drug use: No   Family History  Problem Relation Age of Onset  . Mental illness Sister   . Thyroid disease Sister   . Asthma Brother   . Heart attack Maternal Grandfather   . Thyroid disease Paternal Grandmother   . Hypertension Paternal Grandmother   . Breast cancer Paternal Aunt    Allergies  Allergen Reactions  . Ciprofloxacin     Family allergy - pt never exposed  . Levofloxacin     Family allergies   Current Outpatient Medications on File Prior to Visit  Medication Sig Dispense Refill  . amphetamine-dextroamphetamine (ADDERALL XR) 20 MG 24 hr capsule Take by mouth every morning.    Marland Kitchen amphetamine-dextroamphetamine (ADDERALL) 30 MG tablet Take 15 mg by mouth daily. Early morning  0   No current facility-administered medications on file prior to visit.     Review of Systems  Constitutional: Negative for activity change, appetite change, fatigue, fever and unexpected weight change.  HENT: Negative for congestion, ear pain, rhinorrhea,  sinus pressure and sore throat.   Eyes: Negative for pain, redness and visual disturbance.  Respiratory: Negative for cough, shortness of breath and wheezing.   Cardiovascular: Negative for chest pain and palpitations.  Gastrointestinal: Negative for abdominal pain, blood in stool, constipation and diarrhea.  Endocrine: Negative for polydipsia and polyuria.  Genitourinary: Negative for dysuria, frequency and urgency.  Musculoskeletal: Negative for arthralgias, back pain and myalgias.       R foot 5th toe pain and swelling  Skin: Negative for pallor and rash.  Allergic/Immunologic: Negative for environmental  allergies.  Neurological: Negative for dizziness, syncope and headaches.  Hematological: Negative for adenopathy. Does not bruise/bleed easily.  Psychiatric/Behavioral: Negative for decreased concentration and dysphoric mood. The patient is not nervous/anxious.        Objective:   Physical Exam Constitutional:      General: She is not in acute distress.    Appearance: Normal appearance. She is normal weight. She is not ill-appearing.  Eyes:     Conjunctiva/sclera: Conjunctivae normal.     Pupils: Pupils are equal, round, and reactive to light.  Musculoskeletal:     Comments: R 5th toe is swollen and tender / slightly bruised  Nail intact  No skin interruption  Pt can bear wt with a limp  Pain to flex more than extend  Nl perfusion/appearance of foot and ankle  Skin:    General: Skin is warm and dry.     Findings: No bruising, erythema or rash.  Neurological:     Mental Status: She is alert.           Assessment & Plan:   Problem List Items Addressed This Visit      Musculoskeletal and Integument   Toe fracture    R 5th toe fracture from injury 10 d ago -non displaced Has improved clinically with buddy taping Given post op shoe today- makes walking more comfortable Recommend ice /elevation Aleve prn with food  Update if not starting to improve in a week or if worsening

## 2020-04-12 NOTE — Assessment & Plan Note (Signed)
R 5th toe fracture from injury 10 d ago -non displaced Has improved clinically with buddy taping Given post op shoe today- makes walking more comfortable Recommend ice /elevation Aleve prn with food  Update if not starting to improve in a week or if worsening

## 2020-04-12 NOTE — Patient Instructions (Signed)
Wear the orthopedic shoe as needed when active  You can still buddy tape the toe for compression/support  Ice is helpful also   For pain relief - aleve 1-2 pills with food every 12 hours as needed  This may help swelling also  Elevate your foot when you sit    Xray now - we will contact you with a result tomorrow

## 2020-07-18 ENCOUNTER — Other Ambulatory Visit: Payer: Self-pay | Admitting: Primary Care

## 2020-07-18 ENCOUNTER — Other Ambulatory Visit (INDEPENDENT_AMBULATORY_CARE_PROVIDER_SITE_OTHER): Payer: Medicaid Other

## 2020-07-18 ENCOUNTER — Telehealth (INDEPENDENT_AMBULATORY_CARE_PROVIDER_SITE_OTHER): Payer: Medicaid Other | Admitting: Primary Care

## 2020-07-18 DIAGNOSIS — J029 Acute pharyngitis, unspecified: Secondary | ICD-10-CM | POA: Diagnosis not present

## 2020-07-18 LAB — POCT RAPID STREP A (OFFICE): Rapid Strep A Screen: NEGATIVE

## 2020-07-18 NOTE — Progress Notes (Signed)
Subjective:    Patient ID: Caitlin Molina, female    DOB: 1990-11-22, 29 y.o.   MRN: [id number]  HPI  Virtual Visit via Video Note  I connected with Caitlin Molina on 07/18/20 at 10:20 AM EST by a video enabled telemedicine application and verified that I am speaking with the correct person using two identifiers.  Location: Patient: Home Provider: Office Participants: Patient and myself   I discussed the limitations of evaluation and management by telemedicine and the availability of in person appointments. The patient expressed understanding and agreed to proceed.  History of Present Illness:  Ms. Caitlin Molina is a 29 year old female who presents today with a chief complaint of sore throat.  Also with dry/wet cough that is overall infrequent., painful swallowing. Symptoms began about three days ago with sore throat, progressing now with painful swallowing.   She denies loss of taste/smell, diarrhea, headaches, known exposure to Covid-19. She is fully vaccinated against Covid-19. She's taken Nyqil and Cold Eaze last night and today.   Her daughter had pink eye last week, now has a rash to the extremities. Her mother is now developing symptoms of sore throat as of today.    Observations/Objective:  Alert and oriented. Appears well, not sickly. No distress. Speaking in complete sentences. No cough during visit.   Assessment and Plan:  Acute URI symptoms x 3 days, now with painful swallowing. Will need to check rapid strep and Covid-19. She will come in for those tests this afternoon. Continue conservative treatment for now.  Follow Up Instructions:  We will contact you regarding your tests for this afternoon.  It was a pleasure to see you today! [name], NP-C    I discussed the assessment and treatment plan with the patient. The patient was provided an opportunity to ask questions and all were answered. The patient agreed with the plan and demonstrated an understanding of  the instructions.   The patient was advised to call back or seek an in-person evaluation if the symptoms worsen or if the condition fails to improve as anticipated.    [name], NP    Review of Systems  Constitutional: Negative for chills, fatigue and fever.  HENT: Positive for sore throat. Negative for congestion.   Respiratory: Positive for cough.   Gastrointestinal: Negative for diarrhea.  Neurological: Negative for headaches.       Past Medical History:  Diagnosis Date  . ADD (attention deficit disorder)   . Anxiety   . Hypothyroidism due to Hashimoto's thyroiditis   . Vitamin D deficiency      Social History   Socioeconomic History  . Marital status: Single    Spouse name: Not on file  . Number of children: Not on file  . Years of education: Not on file  . Highest education level: Not on file  Occupational History  . Not on file  Tobacco Use  . Smoking status: Never Smoker  . Smokeless tobacco: Never Used  Vaping Use  . Vaping Use: Never used  Substance and Sexual Activity  . Alcohol use: Yes    Comment: Socially  . Drug use: No  . Sexual activity: Not Currently    Birth control/protection: Pill  Other Topics Concern  . Not on file  Social History Narrative   Single.   No children.   Works for [employer].   Social Determinants of Health   Financial Resource Strain:   . Difficulty of Paying Living Expenses: Not on file  Food Insecurity:   . Worried About Programme researcher, broadcasting/film/video in the Last Year: Not on file  . Ran Out of Food in the Last Year: Not on file  Transportation Needs:   . Lack of Transportation (Medical): Not on file  . Lack of Transportation (Non-Medical): Not on file  Physical Activity:   . Days of Exercise per Week: Not on file  . Minutes of Exercise per Session: Not on file  Stress:   . Feeling of Stress : Not on file  Social Connections:   . Frequency of Communication with Friends and Family: Not on file  .  Frequency of Social Gatherings with Friends and Family: Not on file  . Attends Religious Services: Not on file  . Active Member of Clubs or Organizations: Not on file  . Attends Banker Meetings: Not on file  . Marital Status: Not on file  Intimate Partner Violence:   . Fear of Current or Ex-Partner: Not on file  . Emotionally Abused: Not on file  . Physically Abused: Not on file  . Sexually Abused: Not on file    Past Surgical History:  Procedure Laterality Date  . WISDOM TOOTH EXTRACTION  2013    Family History  Problem Relation Age of Onset  . Mental illness Sister   . Thyroid disease Sister   . Asthma Brother   . Heart attack Maternal Grandfather   . Thyroid disease Paternal Grandmother   . Hypertension Paternal Grandmother   . Breast cancer Paternal Aunt     Allergies  Allergen Reactions  . Ciprofloxacin     Family allergy - pt never exposed  . Levofloxacin     Family allergies    Current Outpatient Medications on File Prior to Visit  Medication Sig Dispense Refill  . amphetamine-dextroamphetamine (ADDERALL XR) 20 MG 24 hr capsule Take by mouth every morning.    Marland Kitchen amphetamine-dextroamphetamine (ADDERALL) 30 MG tablet Take 15 mg by mouth daily. Early morning  0   No current facility-administered medications on file prior to visit.    There were no vitals taken for this visit.   Objective:   Physical Exam Constitutional:      Appearance: She is not ill-appearing.  Pulmonary:     Effort: Pulmonary effort is normal.     Comments: No cough during visit Neurological:     Mental Status: She is alert and oriented to person, place, and time.            Assessment & Plan:

## 2020-07-18 NOTE — Assessment & Plan Note (Signed)
Acute URI symptoms x 3 days, now with painful swallowing. Will need to check rapid strep and Covid-19. She will come in for those tests this afternoon. Continue conservative treatment for now.

## 2020-07-18 NOTE — Patient Instructions (Signed)
We will contact you regarding your tests for this afternoon.  It was a pleasure to see you today! [name], NP-C

## 2020-07-20 LAB — SARS-COV-2, NAA 2 DAY TAT

## 2020-07-20 LAB — NOVEL CORONAVIRUS, NAA: SARS-CoV-2, NAA: NOT DETECTED

## 2020-09-12 ENCOUNTER — Telehealth: Payer: Medicaid Other | Admitting: Nurse Practitioner

## 2020-09-12 DIAGNOSIS — R5081 Fever presenting with conditions classified elsewhere: Secondary | ICD-10-CM

## 2020-09-12 DIAGNOSIS — R059 Cough, unspecified: Secondary | ICD-10-CM

## 2020-09-12 DIAGNOSIS — R52 Pain, unspecified: Secondary | ICD-10-CM

## 2020-09-12 DIAGNOSIS — Z20822 Contact with and (suspected) exposure to covid-19: Secondary | ICD-10-CM

## 2020-09-12 MED ORDER — BENZONATATE 100 MG PO CAPS: 100.0000 mg | ORAL_CAPSULE | Freq: Three times a day (TID) | ORAL | 0 refills | Status: DC | PRN

## 2020-09-12 MED ORDER — NAPROXEN 500 MG PO TABS: 500.0000 mg | ORAL_TABLET | Freq: Two times a day (BID) | ORAL | 0 refills | Status: DC

## 2020-09-12 NOTE — Progress Notes (Signed)
E-Visit for Corona Virus Screening  Your current symptoms could be consistent with the coronavirus.  Many health care providers can now test patients at their office but not all are.  [facility] has multiple testing sites. For information on our COVID testing locations and hours go to [email or web address]   Testing Information: The COVID-19 Community Testing sites are testing BY APPOINTMENT ONLY.  You can schedule online at [email or web address]  If you do not have access to a smart phone or computer you may call [phone] for an appointment.   Additional testing sites in the Community:  . For [facility] Testing sites in [place]  https://www.cvs.com/minuteclinic/covid-19-testing  . For Pop-up testing sites in [place]  https://covid19.ncdhhs.gov/about-covid-19/testing/find-my-testing-place/pop-testing-sites  . For [facility] https://[place].gov[place]/human-services/health-department/coronavirus-covid-19-info/covid-19-testing  . For [place] testing in [place] https://www.[place].gov/[place]/human-services/health-department/coronavirus-covid-19-info/covid-19-testing  . For [facility] testing in [place]   https://[facility].care/covidtesting  For  more information about community testing call [phone]   Please quarantine yourself while awaiting your test results. Please stay home for a minimum of 10 days from the first day of illness with improving symptoms and you have had 24 hours of no fever (without the use of Tylenol (Acetaminophen) Motrin (Ibuprofen) or any fever reducing medication).  Also - Do not get tested prior to returning to work because once you have had a positive test the test can stay positive for more than a month in some cases.   You should wear a mask or cloth face covering over your nose and mouth if you must be around other people or animals, including pets (even at home). Try to  stay at least 6 feet away from other people. This will protect the people around you.  Please continue good preventive care measures, including:  frequent hand-washing, avoid touching your face, cover coughs/sneezes, stay out of crowds and keep a 6 foot distance from others.  COVID-19 is a respiratory illness with symptoms that are similar to the flu. Symptoms are typically mild to moderate, but there have been cases of severe illness and death due to the virus.   The following symptoms may appear 2-14 days after exposure: . Fever . Cough . Shortness of breath or difficulty breathing . Chills . Repeated shaking with chills . Muscle pain . Headache . Sore throat . New loss of taste or smell . Fatigue . Congestion or runny nose . Nausea or vomiting . Diarrhea  Go to the nearest hospital ED for assessment if fever/cough/breathlessness are severe or illness seems like a threat to life.  It is vitally important that if you feel that you have an infection such as this virus or any other virus that you stay home and away from places where you may spread it to others.  You should avoid contact with people age 65 and older.   You can use medication such as A prescription cough medication called Tessalon Perles 100 mg. You may take 1-2 capsules every 8 hours as needed for cough and A prescription anti-inflammatory called Naprosyn 500 mg. Take twice daily as needed for fever or body aches for 2 weeks  You may also take acetaminophen (Tylenol) as needed for fever.  Reduce your risk of any infection by using the same precautions used for avoiding the common cold or flu:  . Wash your hands often with soap and warm water for at least 20 seconds.  If soap and water are not readily available, use an alcohol-based hand sanitizer with at least 60% alcohol.  .   If coughing or sneezing, cover your mouth and nose by coughing or sneezing into the elbow areas of your shirt or coat, into a tissue or into your sleeve  (not your hands). . Avoid shaking hands with others and consider head nods or verbal greetings only. . Avoid touching your eyes, nose, or mouth with unwashed hands.  . Avoid close contact with people who are sick. . Avoid places or events with large numbers of people in one location, like concerts or sporting events. . Carefully consider travel plans you have or are making. . If you are planning any travel outside or inside the [place], visit the CDC's Travelers' Health webpage for the latest health notices. . If you have some symptoms but not all symptoms, continue to monitor at home and seek medical attention if your symptoms worsen. . If you are having a medical emergency, call 911.  HOME CARE . Only take medications as instructed by your medical team. . Drink plenty of fluids and get plenty of rest. . A steam or ultrasonic humidifier can help if you have congestion.   GET HELP RIGHT AWAY IF YOU HAVE EMERGENCY WARNING SIGNS** FOR COVID-19. If you or someone is showing any of these signs seek emergency medical care immediately. Call 911 or proceed to your closest emergency facility if: . You develop worsening high fever. . Trouble breathing . Bluish lips or face . Persistent pain or pressure in the chest . New confusion . Inability to wake or stay awake . You cough up blood. . Your symptoms become more severe  **This list is not all possible symptoms. Contact your medical provider for any symptoms that are sever or concerning to you.  MAKE SURE YOU   Understand these instructions.  Will watch your condition.  Will get help right away if you are not doing well or get worse.  Your e-visit answers were reviewed by a board certified advanced clinical practitioner to complete your personal care plan.  Depending on the condition, your plan could have included both over the counter or prescription medications.  If there is a problem please reply once you have received a response from your  provider.  Your safety is important to us.  If you have drug allergies check your prescription carefully.    You can use MyChart to ask questions about today's visit, request a non-urgent call back, or ask for a work or school excuse for 24 hours related to this e-Visit. If it has been greater than 24 hours you will need to follow up with your provider, or enter a new e-Visit to address those concerns. You will get an e-mail in the next two days asking about your experience.  I hope that your e-visit has been valuable and will speed your recovery. Thank you for using e-visits.   5-10 minutes spent reviewing and documenting in chart.  

## 2020-10-18 ENCOUNTER — Encounter: Payer: Self-pay | Admitting: Primary Care

## 2020-10-18 ENCOUNTER — Ambulatory Visit (INDEPENDENT_AMBULATORY_CARE_PROVIDER_SITE_OTHER): Payer: BLUE CROSS/BLUE SHIELD | Admitting: Primary Care

## 2020-10-18 ENCOUNTER — Other Ambulatory Visit: Payer: Self-pay

## 2020-10-18 VITALS — BP 122/74 | HR 90 | Temp 97.6°F | Ht 66.25 in | Wt 150.0 lb

## 2020-10-18 DIAGNOSIS — B029 Zoster without complications: Secondary | ICD-10-CM | POA: Diagnosis not present

## 2020-10-18 DIAGNOSIS — Z833 Family history of diabetes mellitus: Secondary | ICD-10-CM | POA: Diagnosis not present

## 2020-10-18 HISTORY — DX: Zoster without complications: B02.9

## 2020-10-18 LAB — POCT GLYCOSYLATED HEMOGLOBIN (HGB A1C): Hemoglobin A1C: 4.9 % (ref 4.0–5.6)

## 2020-10-18 MED ORDER — VALACYCLOVIR HCL 1 G PO TABS: 1000.0000 mg | ORAL_TABLET | Freq: Three times a day (TID) | ORAL | 0 refills | Status: DC

## 2020-10-18 NOTE — Patient Instructions (Signed)
Start valacyclovir (Valtrex) 1000 mg for shingles. Take 1 tablet by mouth three times daily for one week.  Please update me Friday this week!  It was a pleasure to see you today!

## 2020-10-18 NOTE — Progress Notes (Signed)
Subjective:    Patient ID: Caitlin Molina, female    DOB: May 09, 1991, 30 y.o.   MRN: [id number]  HPI  This visit occurred during the SARS-CoV-2 public health emergency.  Safety protocols were in place, including screening questions prior to the visit, additional usage of staff PPE, and extensive cleaning of exam room while observing appropriate contact time as indicated for disinfecting solutions.   Caitlin Molina is a 30 year old female who presents today with a chief complaint of rash.   Her brother was recently diagnosed with new onset Type 1 Diabetes, she would like to be tested today.   Her rash is located to the left lateral trunk distal to axilla for which she noticed last night after showering. She noticed the rash had not abated this morning which prompted her visit today.   She denies itching. She has noticed a slight "raw" sensation to the site this morning. She denies increased stress.   No new lotions, detergents, soaps or shampoos. No new medicines, vitamins, supplements. No new pets. No recent outdoor exposure or poison ivy exposure. No bonfire or smoke exposure.  No recent motel or hotel stay or new beds.   No fevers/chills, oral lesions, new joint pains, tick bites, abdominal pain, nausea.   She has a family history of shingles in her cousin who is a few years older.   Review of Systems  Endocrine: Negative for polydipsia, polyphagia and polyuria.  Skin: Positive for rash.  Neurological: Negative for numbness.       Past Medical History:  Diagnosis Date  . ADD (attention deficit disorder)   . Anxiety   . Hypothyroidism due to Hashimoto's thyroiditis   . Vitamin D deficiency      Social History   Socioeconomic History  . Marital status: Single    Spouse name: Not on file  . Number of children: Not on file  . Years of education: Not on file  . Highest education level: Not on file  Occupational History  . Not on file  Tobacco Use  . Smoking status: Never  Smoker  . Smokeless tobacco: Never Used  Vaping Use  . Vaping Use: Never used  Substance and Sexual Activity  . Alcohol use: Yes    Comment: Socially  . Drug use: No  . Sexual activity: Not Currently    Birth control/protection: Pill  Other Topics Concern  . Not on file  Social History Narrative   Single.   No children.   Works for [employer].   Social Determinants of Health   Financial Resource Strain: Not on file  Food Insecurity: Not on file  Transportation Needs: Not on file  Physical Activity: Not on file  Stress: Not on file  Social Connections: Not on file  Intimate Partner Violence: Not on file    Past Surgical History:  Procedure Laterality Date  . WISDOM TOOTH EXTRACTION  2013    Family History  Problem Relation Age of Onset  . Mental illness Sister   . Thyroid disease Sister   . Asthma Brother   . Diabetes Mellitus I Brother   . Heart attack Maternal Grandfather   . Thyroid disease Paternal Grandmother   . Hypertension Paternal Grandmother   . Breast cancer Paternal Aunt     Allergies  Allergen Reactions  . Ciprofloxacin     Family allergy - pt never exposed  . Levofloxacin     Family allergies    Current Outpatient Medications on File Prior  to Visit  Medication Sig Dispense Refill  . amphetamine-dextroamphetamine (ADDERALL) 20 MG tablet Take 20 mg by mouth daily.     No current facility-administered medications on file prior to visit.    BP 122/74   Pulse 90   Temp 97.6 F (36.4 C) (Temporal)   Ht 5' 6.25" (1.683 m)   Wt 150 lb (68 kg)   SpO2 99%   BMI 24.03 kg/m    Objective:   Physical Exam Constitutional:      Appearance: She is well-nourished.  Cardiovascular:     Rate and Rhythm: Normal rate.  Pulmonary:     Effort: Pulmonary effort is normal.  Musculoskeletal:     Cervical back: Neck supple.  Skin:    General: Skin is warm and dry.     Findings: Rash present.     Comments: Erythematous patch with several  raised rounded bumps to left lateral trunk distal to axilla. Mildly tender.  Psychiatric:        Mood and Affect: Mood and affect normal.            Assessment & Plan:

## 2020-10-18 NOTE — Assessment & Plan Note (Signed)
Acute rash today is representative of herpes zoster.  Treat with valacyclovir 1000 mg TID x 7 days.   She will update in a few days.

## 2020-10-18 NOTE — Assessment & Plan Note (Signed)
In brother who was recently diagnosed. A1C pending today.  She does not exhibit signs/symptoms of diabetes.

## 2020-11-10 DIAGNOSIS — Z01419 Encounter for gynecological examination (general) (routine) without abnormal findings: Secondary | ICD-10-CM | POA: Diagnosis not present

## 2020-11-10 DIAGNOSIS — Z124 Encounter for screening for malignant neoplasm of cervix: Secondary | ICD-10-CM | POA: Diagnosis not present

## 2020-11-10 DIAGNOSIS — Z113 Encounter for screening for infections with a predominantly sexual mode of transmission: Secondary | ICD-10-CM | POA: Diagnosis not present

## 2021-02-10 ENCOUNTER — Telehealth: Payer: Self-pay

## 2021-02-10 DIAGNOSIS — W57XXXA Bitten or stung by nonvenomous insect and other nonvenomous arthropods, initial encounter: Secondary | ICD-10-CM | POA: Diagnosis not present

## 2021-02-10 DIAGNOSIS — S70361A Insect bite (nonvenomous), right thigh, initial encounter: Secondary | ICD-10-CM | POA: Diagnosis not present

## 2021-02-10 NOTE — Telephone Encounter (Signed)
Noted. Will await notes.  

## 2021-02-10 NOTE — Telephone Encounter (Signed)
I spoke with pt; on 02/07/21 pt thinks got spider bite on lt thigh (did not see spider) and now area appears to be red around site of bite at 1 "and a larger lighter redness around that about 1 1/2 more inches. Area itches but no pain and no drainage. Appears like a bullseye looking rash but pt has not seen a tick. No swelling in mouth tongue or throat and no difficulty breathing. No available appts at [facility]. Pt is presently already at [facility] in [place] to be evaluated and any needed testing. Sending FYI to Allayne Gitelman NP who is out of office today.

## 2021-02-10 NOTE — Telephone Encounter (Signed)
[facility] Night - Client Nonclinical Telephone Record AccessNurse Client [facility] Night - Client Client Site [facility] - Night Physician Vernona Rieger - NP Contact Type Call Who Is Calling Patient / Member / Family / Caregiver Caller Name Altovise Wahler Caller Phone Number [phone] Patient Name Caitlin Molina Patient DOB 10-Oct-1990 Call Type Message Only Information Provided Reason for Call Request to Schedule Office Appointment Initial Comment Caller states she wants to schedule an apt for today, has what seem to be a spider bite that is growing. Patient request to speak to RN No Additional Comment Caller declined triage and office hours were provided. Caller is requesting a call back. Disp. Time Disposition Final User 02/10/2021 8:08:33 AM General Information Provided Yes Schlegelmilch, Fleet Contras Call Closed By: Fleet Contras Schlegelmilch Transaction Date/Time: 02/10/2021 8:05:13 AM (ET)

## 2021-08-27 DIAGNOSIS — S161XXA Strain of muscle, fascia and tendon at neck level, initial encounter: Secondary | ICD-10-CM | POA: Diagnosis not present

## 2021-08-27 DIAGNOSIS — M542 Cervicalgia: Secondary | ICD-10-CM | POA: Diagnosis not present

## 2021-10-30 DIAGNOSIS — S92515A Nondisplaced fracture of proximal phalanx of left lesser toe(s), initial encounter for closed fracture: Secondary | ICD-10-CM | POA: Diagnosis not present

## 2022-03-29 DIAGNOSIS — Z23 Encounter for immunization: Secondary | ICD-10-CM | POA: Diagnosis not present

## 2022-03-29 DIAGNOSIS — A630 Anogenital (venereal) warts: Secondary | ICD-10-CM | POA: Diagnosis not present

## 2022-03-29 DIAGNOSIS — E039 Hypothyroidism, unspecified: Secondary | ICD-10-CM | POA: Diagnosis not present

## 2022-04-05 ENCOUNTER — Ambulatory Visit (INDEPENDENT_AMBULATORY_CARE_PROVIDER_SITE_OTHER): Payer: 59 | Admitting: Family Medicine

## 2022-04-05 ENCOUNTER — Encounter: Payer: Self-pay | Admitting: Family Medicine

## 2022-04-05 VITALS — BP 102/74 | HR 74 | Temp 97.5°F | Ht 66.25 in | Wt 166.0 lb

## 2022-04-05 DIAGNOSIS — L739 Follicular disorder, unspecified: Secondary | ICD-10-CM | POA: Diagnosis not present

## 2022-04-05 DIAGNOSIS — R309 Painful micturition, unspecified: Secondary | ICD-10-CM | POA: Diagnosis not present

## 2022-04-05 LAB — POC URINALSYSI DIPSTICK (AUTOMATED)
Bilirubin, UA: NEGATIVE
Blood, UA: NEGATIVE
Glucose, UA: NEGATIVE
Ketones, UA: NEGATIVE
Leukocytes, UA: NEGATIVE
Nitrite, UA: NEGATIVE
Protein, UA: NEGATIVE
Spec Grav, UA: <=1.005 — AB (ref 1.010–1.025)
Urobilinogen, UA: 0.2 E.U./dL
pH, UA: 6 (ref 5.0–8.0)

## 2022-04-05 MED ORDER — AMPHETAMINE-DEXTROAMPHETAMINE 20 MG PO TABS: 20.0000 mg | ORAL_TABLET | Freq: Two times a day (BID) | ORAL | Status: AC

## 2022-04-05 MED ORDER — CEPHALEXIN 500 MG PO CAPS: 500.0000 mg | ORAL_CAPSULE | Freq: Three times a day (TID) | ORAL | 0 refills | Status: DC

## 2022-04-05 MED ORDER — PENICILLIN V POTASSIUM 500 MG PO TABS: 500.0000 mg | ORAL_TABLET | Freq: Two times a day (BID) | ORAL | Status: DC

## 2022-04-05 NOTE — Progress Notes (Signed)
Dx'd with strep and on oral penicillin currently.  Has been on for 2.5 days, 5 doses.  ST is clearly better.    Recently with dysuria.  Pain with urination.  Noted an irritated lesion on labia a few days ago, this week.  Dysuria started around the same time, this week.  She thought the burning was external.  No discharge.  Recent started menses.    Meds, vitals, and allergies reviewed.   ROS: Per HPI unless specifically indicated in ROS section   Nad Ncat Neck with Mild B LA and OP with posterior irritation.   MMM Rrr Ctab Abdomen nontender. Chaperoned exam with L sided labial irritation that looks like superficial folliculitis, only noted on the left side.

## 2022-04-05 NOTE — Patient Instructions (Signed)
Stop penicillin and change to keflex.  Update [place] as needed.  Take care.  Glad to see you.

## 2022-04-08 DIAGNOSIS — L739 Follicular disorder, unspecified: Secondary | ICD-10-CM | POA: Insufficient documentation

## 2022-04-08 NOTE — Assessment & Plan Note (Signed)
With concurrent strep throat.  Reasonable to change from amoxicillin to Keflex and then follow-up as needed.  Keflex should cover both.

## 2022-09-06 ENCOUNTER — Telehealth: Payer: Self-pay | Admitting: Primary Care

## 2022-09-06 NOTE — Telephone Encounter (Signed)
Noted. Let's call to check on patient 12/29. If vomiting has continued then recommend we add her on for 12/29.

## 2022-09-06 NOTE — Telephone Encounter (Signed)
I spoke with pt who is drinking water and is keeping water down since 6 AM this morning. Last vomited around 6 AM this morning. Pt does not have any abd pain and no fever.pt has had bright red blood few drops on and off with constipated BM for couple of months. Last blood in BM was in water of commode on 09/05/22. Pt declines UC or ED. UC & ED precautions given and pt voiced understanding. Pt already has appt scheduled with [name] NP on 09/12/22 at 12 noon. Sending note to Allayne Gitelman NP, [facility] and will teams [facility] CMA.

## 2022-09-06 NOTE — Telephone Encounter (Signed)
[facility] Day - Client TELEPHONE ADVICE RECORD AccessNurse Patient Name: Caitlin Molina PER Gender: Female DOB: 25-Nov-1990 Age: 31 Y Return Phone Number: [phone] (Primary) Address: City/ State/ Zip: [place] [address] Client [facility] Day - Client Client Site [facility] - Day Provider Vernona Rieger - NP Contact Type Call Who Is Calling Patient / Member / Family / Caregiver Call Type Triage / Clinical Relationship To Patient Self Return Phone Number [phone] (Primary) Chief Complaint Rectal Bleeding Reason for Call Symptomatic / Request for Health Information Initial Comment Caller states she has been vomiting since the middle of the night and she has rectal bleeding that has been going on for a couple of months. Translation No Nurse Assessment Nurse: D'Heur Ezzard Standing, RN, Adrienne Date/Time (Eastern Time): 09/06/2022 8:36:36 AM Confirm and document reason for call. If symptomatic, describe symptoms. ---Caller states she has been vomiting since the middle of the night and she has rectal bleeding that has been going on for a couple of months. It started out intermittent but has gotten worse. She does c/o some constipation but eats prunes which helps. Does the patient have any new or worsening symptoms? ---Yes Will a triage be completed? ---Yes Related visit to physician within the last 2 weeks? ---No Does the PT have any chronic conditions? (i.e. diabetes, asthma, this includes High risk factors for pregnancy, etc.) ---Yes List chronic conditions. ---ADHD Is the patient pregnant or possibly pregnant? (Ask all females between the ages of 71-55) ---No Is this a behavioral health or substance abuse call? ---No Guidelines Guideline Title Affirmed Question Affirmed Notes Nurse Date/Time Lamount Cohen Time) Rectal Bleeding MILD rectal bleeding (more than just a few drops or streaks) D'Heur  Ezzard Standing, RN, Hansel Starling 09/06/2022 8:38:35 AM Vomiting [1] SEVERE vomiting (e.g., 6 or more times/day, D'Heur Ezzard Standing, RN, Hansel Starling 09/06/2022 8:43:16 AM PLEASE NOTE: All timestamps contained within this report are represented as [place] Standard Time. CONFIDENTIALTY NOTICE: This fax transmission is intended only for the addressee. It contains information that is legally privileged, confidential or otherwise protected from use or disclosure. If you are not the intended recipient, you are strictly prohibited from reviewing, disclosing, copying using or disseminating any of this information or taking any action in reliance on or regarding this information. If you have received this fax in error, please notify us immediately by telephone so that we can arrange for its return to [place]. Phone: [phone], Toll-Free: [phone], Fax: [phone] Page: 2 of 2 Call Id: [id number] Guidelines Guideline Title Affirmed Question Affirmed Notes Nurse Date/Time Lamount Cohen Time) vomits everything) BUT [2] hydrated Disp. Time Lamount Cohen Time) Disposition Final User 09/06/2022 8:42:55 AM SEE PCP WITHIN 3 DAYS D'Heur Margaretmary Dys 09/06/2022 8:49:51 AM Home Care Yes D'Heur Ezzard Standing, RN, Adrienne Final Disposition 09/06/2022 8:49:51 AM Home Care Yes D'Heur Ezzard Standing, RN, Hansel Starling Caller Disagree/Comply Comply Caller Understands Yes PreDisposition Call Doctor Care Advice Given Per Guideline SEE PCP WITHIN 3 DAYS: * You need to be seen within 2 or 3 days. TO SOFTEN STOOLS AND TREAT CONSTIPATION: * Eat a high fiber diet. * Drink adequate liquids (6-8 glasses of water a day) CALL BACK IF: * Dizziness occurs * Bleeding increases * You become worse CARE ADVICE given per Rectal Bleeding (Adult) guideline. HOME CARE: * You should be able to treat this at home. SLEEP: * Rest and try to sleep. Reason: Sleep often empties the stomach and relieves the need to vomit. DRINK CLEAR FLUIDS: * Drink clear fluids  in small  amounts for 8 hours. * Sip water or rehydration liquid (Gatorade or Powerade). SOLID FOOD: * You may begin eating bland foods after 8 hours without vomiting. CALL BACK IF: * Severe vomiting (6 or more times per day; vomiting everything) lasts more than 8 hours * Vomit contains bile (green color) * Constant stomach pain lasting more than 2 hours * Signs of dehydration (e.g., no urination over 12 hours, very lightheaded) * You become worse CARE ADVICE per Vomiting (Adult) guideline. Comments User: Hansel Starling, D'Heur Ezzard Standing, RN Date/Time Lamount Cohen Time): 09/06/2022 8:39:22 AM Caller states she has vomited 6x since last night

## 2022-09-06 NOTE — Telephone Encounter (Signed)
Patient called in stating that she has been vomiting since the middle of the morning,and she has been experiencing rectal bleeding for a few months now. Due to her symptoms,I sent her to be triaged with access nurse.

## 2022-09-07 NOTE — Telephone Encounter (Signed)
Unable to reach patient. Left voicemail to return call to our office.   

## 2022-09-07 NOTE — Telephone Encounter (Signed)
Called and spoke with patient she states se is doing much better than yesterday. She only vomited once yesterday evening and that was due to her eating some crackers. She said since then she hasn't thrown up and is feeling better. She agreed to keep appointment 09/12/21 and will update [place] if her symptoms return or worsen before then.

## 2022-09-12 ENCOUNTER — Ambulatory Visit (INDEPENDENT_AMBULATORY_CARE_PROVIDER_SITE_OTHER): Payer: Managed Care, Other (non HMO) | Admitting: Primary Care

## 2022-09-12 ENCOUNTER — Encounter: Payer: Self-pay | Admitting: Primary Care

## 2022-09-12 VITALS — BP 124/86 | HR 85 | Temp 99.5°F | Ht 66.25 in | Wt 166.0 lb

## 2022-09-12 DIAGNOSIS — E039 Hypothyroidism, unspecified: Secondary | ICD-10-CM

## 2022-09-12 DIAGNOSIS — K625 Hemorrhage of anus and rectum: Secondary | ICD-10-CM

## 2022-09-12 DIAGNOSIS — H9213 Otorrhea, bilateral: Secondary | ICD-10-CM

## 2022-09-12 LAB — HEMOCCULT GUIAC POC 1CARD (OFFICE): Fecal Occult Blood, POC: NEGATIVE

## 2022-09-12 MED ORDER — HYDROCORTISONE ACETATE 25 MG RE SUPP: 25.0000 mg | Freq: Two times a day (BID) | RECTAL | 0 refills | Status: DC

## 2022-09-12 NOTE — Patient Instructions (Addendum)
Use Anusol suppository two times daily.  Please make a lab appointment for have your CBC and TSH checked.  Referral for GI has been placed and you will get a my chart notification or a phone call regarding that.   It was a pleasure to see you today!

## 2022-09-12 NOTE — Assessment & Plan Note (Addendum)
Given her history of constipation, will recheck TSH today.  Remain off treatment.  I evaluated patient, was consulted regarding treatment, and agree with assessment and plan per Tinnie Gens, RN, DNP student.   Allie Bossier, NP-C

## 2022-09-12 NOTE — Assessment & Plan Note (Addendum)
Rectal exam revealed internal hemorrhoid. Guaiac negative.   Will treat with Anusol suppository twice daily. Prescription sent.   Referral for GI placed for further evaluation, especially if symptoms do not respond to treatment.  I evaluated patient, was consulted regarding treatment, and agree with assessment and plan per Tinnie Gens, RN, DNP student.   Allie Bossier, NP-C

## 2022-09-12 NOTE — Progress Notes (Signed)
Subjective:    Patient ID: Caitlin Molina, female    DOB: [birth year], 32 y.o.   MRN: [id number]  Rectal Bleeding  Pertinent negatives include no nausea.  Ear Drainage  Associated symptoms include ear discharge.    Caitlin Molina is a very pleasant 32 y.o. female with a history of hypothyroidism who presents today to discuss rectal bleeding and ear drainage.  1) Rectal Bleeding: Chronic since Summer 2023. Her rectal bleeding is occurring once weekly on average, with a bowel movement, appears bright red and is noticed on the toilet paper, in the stool. She's also noticed constipation, bowels moving every 3-4 days which is different than her norm.   She denies unexplained weight loss, family history colon/rectal cancer, changes in her diet. Last bowel movement was today, no bleeding in the stool.   She has noticed what may be a tear to the anus.   2) Ear Drainage: Acute for the last 2 weeks with itching. Located to the bilateral ears. She began scratching her ears and noticed a cloudy-clear discharge intermittently. She denies pain, changes in hearing.     Review of Systems  HENT:  Positive for ear discharge. Negative for congestion, ear pain and sinus pressure.   Gastrointestinal:  Positive for blood in stool, constipation and hematochezia. Negative for nausea.         Past Medical History:  Diagnosis Date   ADD (attention deficit disorder)    Anxiety    Hypothyroidism due to Hashimoto's thyroiditis    Vitamin D deficiency     Social History   Socioeconomic History   Marital status: Single    Spouse name: Not on file   Number of children: Not on file   Years of education: Not on file   Highest education level: Not on file  Occupational History   Not on file  Tobacco Use   Smoking status: Never   Smokeless tobacco: Never  Vaping Use   Vaping Use: Never used  Substance and Sexual Activity   Alcohol use: Yes    Comment: Socially   Drug use: No   Sexual activity: Not  Currently    Birth control/protection: Pill  Other Topics Concern   Not on file  Social History Narrative   Single.   No children.   Works for [employer]   Social Determinants of Health   Financial Resource Strain: Not on file  Food Insecurity: Not on file  Transportation Needs: Not on file  Physical Activity: Not on file  Stress: Not on file  Social Connections: Not on file  Intimate Partner Violence: Not on file    Past Surgical History:  Procedure Laterality Date   WISDOM TOOTH EXTRACTION  2013    Family History  Problem Relation Age of Onset   Mental illness Sister    Thyroid disease Sister    Asthma Brother    Diabetes Mellitus I Brother    Heart attack Maternal Grandfather    Thyroid disease Paternal Grandmother    Hypertension Paternal Grandmother    Breast cancer Paternal Aunt     Allergies  Allergen Reactions   Ciprofloxacin     Family allergy - pt never exposed   Levofloxacin     Family allergies    Current Outpatient Medications on File Prior to Visit  Medication Sig Dispense Refill   amphetamine-dextroamphetamine (ADDERALL) 20 MG tablet Take 1 tablet (20 mg total) by mouth 2 (two) times daily.     cephALEXin (KEFLEX) 500 MG  capsule Take 1 capsule (500 mg total) by mouth 3 (three) times daily. (Patient not taking: Reported on 09/12/2022) 21 capsule 0   No current facility-administered medications on file prior to visit.    BP 124/86   Pulse 85   Temp 99.5 F (37.5 C) (Temporal)   Ht 5' 6.25" (1.683 m)   Wt 166 lb (75.3 kg)   SpO2 100%   BMI 26.59 kg/m  Objective:   Physical Exam Constitutional:      General: She is not in acute distress. HENT:     Head:     Comments: Scant amount of soft, light brown cerumen to bilateral canals.  Pulmonary:     Effort: Pulmonary effort is normal.  Genitourinary:    Rectum: Guaiac result negative. Internal hemorrhoid present. No anal fissure or external hemorrhoid.  Skin:    General: Skin  is warm and dry.  Neurological:     Mental Status: She is alert.           Assessment & Plan:   Problem List Items Addressed This Visit       Digestive   Rectal bleeding - Primary    Rectal exam revealed internal hemorrhoid. Guaiac negative.   Will treat with Anusol suppository twice daily. Prescription sent.   Referral for GI placed for further evaluation, especially if symptoms do not respond to treatment.  I evaluated patient, was consulted regarding treatment, and agree with assessment and plan per Tinnie Gens, RN, DNP student.   Allie Bossier, NP-C       Relevant Medications   hydrocortisone (ANUSOL-HC) 25 MG suppository   Other Relevant Orders   CBC   Ambulatory referral to Gastroenterology   POCT occult blood stool     Endocrine   Hypothyroidism    Given her history of constipation, will recheck TSH today.  Remain off treatment.  I evaluated patient, was consulted regarding treatment, and agree with assessment and plan per Tinnie Gens, RN, DNP student.   Allie Bossier, NP-C       Relevant Orders   TSH     Nervous and Auditory   Ear drainage, bilateral    Cerumen, bilaterally, noted on physical exam. No impaction. Discussed with patient today.  I evaluated patient, was consulted regarding treatment, and agree with assessment and plan per Tinnie Gens, RN, DNP student.   Allie Bossier, NP-C           Pleas Koch, NP

## 2022-09-12 NOTE — Assessment & Plan Note (Addendum)
Cerumen, bilaterally, noted on physical exam. No impaction. Discussed with patient today.  I evaluated patient, was consulted regarding treatment, and agree with assessment and plan per Tinnie Gens, RN, DNP student.   Allie Bossier, NP-C

## 2022-09-12 NOTE — Progress Notes (Signed)
Established Patient Office Visit  Subjective   Patient ID: Caitlin Molina, female    DOB: Dec 19, 1990  Age: 32 y.o. MRN: [id number]  Chief Complaint  Patient presents with   Rectal Bleeding    For the past couple months she has noticed once a week her stool will have blood in it, the toilet bowl will turn red.    Ear Drainage    X 1-2 weeks both her ears have been draining a cloudy clear discharge. No earache and no pain    Rectal Bleeding  Pertinent negatives include no fever, no diarrhea, no nausea, no vomiting, no chest pain and no headaches.  Ear Drainage  Associated symptoms include ear discharge. Pertinent negatives include no diarrhea, headaches or vomiting.    Caitlin Molina is a 32 year old female with past medical history of hypothyroidism, ADHD and herpes zoster who presents today to discuss rectal bleeding.  1) Rectal bleeding: Symptoms started over the summer last year. She has noticed intermittent rectal bleeding. She has noticed bright red blood when she wipes. She usually has rectal pain and knows that she will see blood that day. She reports being constipated at times and can go couple of days without a bowel movement. She denies straining. Drinking caffeine helps her with constipation. She has not made any dietary changes. She noticed a change in her bowel habits over the summer and started noticing that she had been more constipated over the summer. She has a history of hypothyroidism and is currently not any medication and has not taken anything since 2019. Her last BM was today. There is no known family history of colon cancer, stomach cancer or rectal cancer.    2) Ear drainage: Symptoms started 2 weeks ago. She noticed discharge with some itching in both ears. Left ear is better but right ear still feels that there is pus in it. She denies any hearing loss. She denies any use recent q-tip usage. Denies any sinus pain, congestion or headaches.    Patient Active Problem  List   Diagnosis Date Noted   Ear drainage, bilateral 09/12/2022   Rectal bleeding 53/66/4403   Folliculitis 47/42/5956   Herpes zoster without complication 38/75/6433   Family history of type 1 diabetes mellitus 10/18/2020   Sore throat 07/18/2020   Toe fracture 04/12/2020   Strain of neck muscle 10/30/2018   Elevated blood pressure affecting pregnancy in first trimester, antepartum 09/14/2017   Supervision of normal pregnancy in second trimester 09/12/2017   Preventative health care 03/28/2017   ADHD 03/28/2017   Hypothyroidism 06/28/2015   Contraception management 06/28/2015   Past Medical History:  Diagnosis Date   ADD (attention deficit disorder)    Anxiety    Hypothyroidism due to Hashimoto's thyroiditis    Vitamin D deficiency    Past Surgical History:  Procedure Laterality Date   WISDOM TOOTH EXTRACTION  2013   Social History   Tobacco Use   Smoking status: Never   Smokeless tobacco: Never  Vaping Use   Vaping Use: Never used  Substance Use Topics   Alcohol use: Yes    Comment: Socially   Drug use: No   Family History  Problem Relation Age of Onset   Mental illness Sister    Thyroid disease Sister    Asthma Brother    Diabetes Mellitus I Brother    Heart attack Maternal Grandfather    Thyroid disease Paternal Grandmother    Hypertension Paternal Grandmother    Breast cancer  Paternal Aunt    Allergies  Allergen Reactions   Ciprofloxacin     Family allergy - pt never exposed   Levofloxacin     Family allergies      Review of Systems  Constitutional:  Negative for chills, fever and weight loss.  HENT:  Positive for ear discharge. Negative for congestion, ear pain, sinus pain and tinnitus.   Respiratory:  Negative for shortness of breath.   Cardiovascular:  Negative for chest pain.  Gastrointestinal:  Positive for blood in stool, constipation and hematochezia. Negative for diarrhea, nausea and vomiting.  Genitourinary:  Negative for frequency and  urgency.  Neurological:  Negative for headaches.      Objective:     BP 124/86   Pulse 85   Temp 99.5 F (37.5 C) (Temporal)   Ht 5' 6.25" (1.683 m)   Wt 166 lb (75.3 kg)   SpO2 100%   BMI 26.59 kg/m  BP Readings from Last 3 Encounters:  09/12/22 124/86  04/05/22 102/74  10/18/20 122/74   Wt Readings from Last 3 Encounters:  09/12/22 166 lb (75.3 kg)  04/05/22 166 lb (75.3 kg)  10/18/20 150 lb (68 kg)      Physical Exam Vitals and nursing note reviewed. Exam conducted with a chaperone present.  Constitutional:      Appearance: Normal appearance.  HENT:     Right Ear: Tympanic membrane, ear canal and external ear normal.     Left Ear: Tympanic membrane, ear canal and external ear normal.     Ears:     Comments: Scant amount of cerumen present bilaterally without impaction. Cardiovascular:     Rate and Rhythm: Normal rate and regular rhythm.     Pulses: Normal pulses.     Heart sounds: Normal heart sounds.  Pulmonary:     Effort: Pulmonary effort is normal.     Breath sounds: Normal breath sounds.  Abdominal:     General: Bowel sounds are normal. There is no distension.     Palpations: Abdomen is soft.     Tenderness: There is no guarding.  Genitourinary:    Rectum: Normal. Guaiac result negative.  Neurological:     Mental Status: She is alert and oriented to person, place, and time.  Psychiatric:        Mood and Affect: Mood normal.        Behavior: Behavior normal.      No results found for any visits on 09/12/22.     The ASCVD Risk score (Arnett DK, et al., 2019) failed to calculate for the following reasons:   The 2019 ASCVD risk score is only valid for ages 63 to 67    Assessment & Plan:   Problem List Items Addressed This Visit       Digestive   Rectal bleeding - Primary    Rectal exam revealed internal hemorrhoid. Guaiac negative.   Will treat with Anusol suppository twice daily. Prescription sent.   Referral for GI placed for  further evaluation, especially if symptoms do not respond to treatment.  I evaluated patient, was consulted regarding treatment, and agree with assessment and plan per Tinnie Gens, RN, DNP student.   Allie Bossier, NP-C       Relevant Medications   hydrocortisone (ANUSOL-HC) 25 MG suppository   Other Relevant Orders   CBC   Ambulatory referral to Gastroenterology   POCT occult blood stool     Endocrine   Hypothyroidism    Given her history  of constipation, will recheck TSH today.  Remain off treatment.  I evaluated patient, was consulted regarding treatment, and agree with assessment and plan per Modesto Charon, RN, DNP student.   [name], NP-C       Relevant Orders   TSH     Nervous and Auditory   Ear drainage, bilateral    Cerumen, bilaterally, noted on physical exam. No impaction. Discussed with patient today.  I evaluated patient, was consulted regarding treatment, and agree with assessment and plan per Modesto Charon, RN, DNP student.   [name], NP-C        No follow-ups on file.    Modesto Charon, BSN-RN, DNP STUDENT

## 2022-09-20 ENCOUNTER — Other Ambulatory Visit (INDEPENDENT_AMBULATORY_CARE_PROVIDER_SITE_OTHER): Payer: Managed Care, Other (non HMO)

## 2022-09-20 DIAGNOSIS — E039 Hypothyroidism, unspecified: Secondary | ICD-10-CM

## 2022-09-20 DIAGNOSIS — K625 Hemorrhage of anus and rectum: Secondary | ICD-10-CM | POA: Diagnosis not present

## 2022-09-20 LAB — CBC
HCT: 40.1 % (ref 36.0–46.0)
Hemoglobin: 13.6 g/dL (ref 12.0–15.0)
MCHC: 33.8 g/dL (ref 30.0–36.0)
MCV: 88 fl (ref 78.0–100.0)
Platelets: 344 10*3/uL (ref 150.0–400.0)
RBC: 4.56 Mil/uL (ref 3.87–5.11)
RDW: 13.6 % (ref 11.5–15.5)
WBC: 7.5 10*3/uL (ref 4.0–10.5)

## 2022-09-20 LAB — TSH: TSH: 5.24 u[IU]/mL (ref 0.35–5.50)

## 2022-09-21 ENCOUNTER — Other Ambulatory Visit (INDEPENDENT_AMBULATORY_CARE_PROVIDER_SITE_OTHER): Payer: Managed Care, Other (non HMO)

## 2022-09-21 DIAGNOSIS — R7989 Other specified abnormal findings of blood chemistry: Secondary | ICD-10-CM

## 2022-09-21 LAB — T4, FREE: Free T4: 0.56 ng/dL — ABNORMAL LOW (ref 0.60–1.60)

## 2022-09-27 ENCOUNTER — Other Ambulatory Visit: Payer: Self-pay | Admitting: Primary Care

## 2022-09-27 DIAGNOSIS — E039 Hypothyroidism, unspecified: Secondary | ICD-10-CM

## 2022-09-27 MED ORDER — LEVOTHYROXINE SODIUM 25 MCG PO TABS: ORAL_TABLET | ORAL | 0 refills | Status: DC

## 2022-11-27 ENCOUNTER — Encounter: Payer: Self-pay | Admitting: Primary Care

## 2022-11-27 ENCOUNTER — Ambulatory Visit (INDEPENDENT_AMBULATORY_CARE_PROVIDER_SITE_OTHER): Payer: Managed Care, Other (non HMO) | Admitting: Primary Care

## 2022-11-27 VITALS — BP 128/70 | HR 78 | Temp 99.9°F | Ht 66.0 in | Wt 166.0 lb

## 2022-11-27 DIAGNOSIS — E039 Hypothyroidism, unspecified: Secondary | ICD-10-CM

## 2022-11-27 DIAGNOSIS — K625 Hemorrhage of anus and rectum: Secondary | ICD-10-CM

## 2022-11-27 LAB — T4, FREE: Free T4: 0.64 ng/dL (ref 0.60–1.60)

## 2022-11-27 LAB — TSH: TSH: 4.31 u[IU]/mL (ref 0.35–5.50)

## 2022-11-27 MED ORDER — HYDROCORTISONE ACETATE 25 MG RE SUPP: 25.0000 mg | Freq: Two times a day (BID) | RECTAL | 1 refills | Status: DC

## 2022-11-27 NOTE — Progress Notes (Signed)
Subjective:    Patient ID: Caitlin Molina, female    DOB: 1991/02/07, 32 y.o.   MRN: [id number]  HPI  Caitlin Molina is a very pleasant 32 y.o. female with a history of hypothyroidism, ADHD on stimulant treatment who presents today for follow up of hypothyroidism and constipation.   She was last evaluated in January 2024 for constipation with rectal bleeding. During this visit she mentioned chronic, progressing constipation so her thyroid levels were checked. TSH revealed level of 5.24 with Free T4 of 0.56. She did have a prior history of hypothyroidism and had been off treatment for years. Given her levels, coupled with prior history and symptoms, we initiated levothyroxine 25 mcg daily.  Since her last visit she's felt more energized. Overall constipation is about the same. She is having bowel movements daily but they are firm. Her rectal bleeding stopped after her last visit, but just had 1 episode with a firm bowel movement. She admits to little water and veggie intake.  She is working on increasing intake of fiber and water now. She would like a refill of her suppositories.    Review of Systems  Constitutional:  Negative for fatigue.  Gastrointestinal:  Positive for blood in stool and constipation.         Past Medical History:  Diagnosis Date   ADD (attention deficit disorder)    Anxiety    Herpes zoster without complication [id number]   Hypothyroidism due to Hashimoto's thyroiditis    Vitamin D deficiency     Social History   Socioeconomic History   Marital status: Single    Spouse name: Not on file   Number of children: Not on file   Years of education: Not on file   Highest education level: Not on file  Occupational History   Not on file  Tobacco Use   Smoking status: Never   Smokeless tobacco: Never  Vaping Use   Vaping Use: Never used  Substance and Sexual Activity   Alcohol use: Yes    Comment: Socially   Drug use: No   Sexual activity: Not Currently     Birth control/protection: Pill  Other Topics Concern   Not on file  Social History Narrative   Single.   No children.   Works for [employer]   Social Determinants of Health   Financial Resource Strain: Not on file  Food Insecurity: Not on file  Transportation Needs: Not on file  Physical Activity: Not on file  Stress: Not on file  Social Connections: Not on file  Intimate Partner Violence: Not on file    Past Surgical History:  Procedure Laterality Date   WISDOM TOOTH EXTRACTION  2013    Family History  Problem Relation Age of Onset   Mental illness Sister    Thyroid disease Sister    Asthma Brother    Diabetes Mellitus I Brother    Heart attack Maternal Grandfather    Thyroid disease Paternal Grandmother    Hypertension Paternal Grandmother    Breast cancer Paternal Aunt     Allergies  Allergen Reactions   Ciprofloxacin     Family allergy - pt never exposed   Levofloxacin     Family allergies    Current Outpatient Medications on File Prior to Visit  Medication Sig Dispense Refill   amphetamine-dextroamphetamine (ADDERALL) 20 MG tablet Take 1 tablet (20 mg total) by mouth 2 (two) times daily.     levothyroxine (SYNTHROID) 25 MCG tablet Take 1 tablet by  mouth every morning on an empty stomach with water only.  No food or other medications for 30 minutes. 90 tablet 0   No current facility-administered medications on file prior to visit.    BP 128/70   Pulse 78   Temp 99.9 F (37.7 C) (Temporal)   Ht 5\' 6"  (1.676 m)   Wt 166 lb (75.3 kg)   LMP 11/21/2022 (Exact Date)   SpO2 100%   Breastfeeding No   BMI 26.79 kg/m  Objective:   Physical Exam Cardiovascular:     Rate and Rhythm: Normal rate and regular rhythm.  Pulmonary:     Effort: Pulmonary effort is normal.     Breath sounds: Normal breath sounds.  Musculoskeletal:     Cervical back: Neck supple.  Skin:    General: Skin is warm and dry.           Assessment & Plan:   Hypothyroidism, unspecified type Assessment & Plan: She is taking levothyroxine 25 mcg correctly.  Continue levothyroxine 25 mcg daily. Repeat TSH and Free T4 pending.  Orders: -     TSH -     T4, free  Rectal bleeding Assessment & Plan: Secondary to hemorrhoids.  Discussed to increase intake of fiber and water.   Refill provided for anusol suppositories as these were effective.  Continue to monitor. Consider GI referral if bleeding persists despite control over constipation.   Orders: -     Hydrocortisone Acetate; Place 1 suppository (25 mg total) rectally 2 (two) times daily.  Dispense: 12 suppository; Refill: [place], NP

## 2022-11-27 NOTE — Assessment & Plan Note (Signed)
Secondary to hemorrhoids.  Discussed to increase intake of fiber and water.   Refill provided for anusol suppositories as these were effective.  Continue to monitor. Consider GI referral if bleeding persists despite control over constipation.

## 2022-11-27 NOTE — Patient Instructions (Signed)
Stop by the lab prior to leaving today. I will notify you of your results once received.   Increase intake of fiber and water.   Use the suppositories as needed.  It was a pleasure to see you today!

## 2022-11-27 NOTE — Assessment & Plan Note (Signed)
She is taking levothyroxine 25 mcg correctly.  Continue levothyroxine 25 mcg daily. Repeat TSH and Free T4 pending.

## 2023-01-04 ENCOUNTER — Other Ambulatory Visit: Payer: Self-pay | Admitting: Primary Care

## 2023-01-04 DIAGNOSIS — E039 Hypothyroidism, unspecified: Secondary | ICD-10-CM

## 2024-01-10 ENCOUNTER — Ambulatory Visit (INDEPENDENT_AMBULATORY_CARE_PROVIDER_SITE_OTHER): Admitting: Primary Care

## 2024-01-10 ENCOUNTER — Encounter: Payer: Self-pay | Admitting: Primary Care

## 2024-01-10 ENCOUNTER — Ambulatory Visit: Payer: Self-pay

## 2024-01-10 VITALS — BP 132/78 | HR 90 | Temp 98.4°F | Ht 66.0 in | Wt 163.0 lb

## 2024-01-10 DIAGNOSIS — K625 Hemorrhage of anus and rectum: Secondary | ICD-10-CM | POA: Diagnosis not present

## 2024-01-10 DIAGNOSIS — L03116 Cellulitis of left lower limb: Secondary | ICD-10-CM | POA: Diagnosis not present

## 2024-01-10 MED ORDER — HYDROCORTISONE ACETATE 25 MG RE SUPP
25.0000 mg | Freq: Two times a day (BID) | RECTAL | 0 refills | Status: AC
Start: 2024-01-10 — End: ?

## 2024-01-10 MED ORDER — DOXYCYCLINE HYCLATE 100 MG PO TABS: 100.0000 mg | ORAL_TABLET | Freq: Two times a day (BID) | ORAL | 0 refills | Status: DC

## 2024-01-10 NOTE — Assessment & Plan Note (Signed)
 No evidence of Lyme Disease, although it would be quite early, however she does have evidence of early cellulitis.   Start doxycycline 100 mg twice daily x 5 days. Return precautions provided.

## 2024-01-10 NOTE — Telephone Encounter (Signed)
Appt scheduled with PCP today.

## 2024-01-10 NOTE — Telephone Encounter (Signed)
 Copied from CRM [id number]. Topic: Clinical - Red Word Triage >> Jan 10, 2024  8:05 AM Deaijah H wrote: Red Word that prompted transfer to Nurse Triage: Tick bite. Swollen/red  Chief Complaint: tick bite Symptoms: tick bite & tick removal from left foot, redness, swelling,  clear drainage Frequency: x 2 days Pertinent Negatives: Patient denies fever Disposition: [] ED /[] Urgent Care (no appt availability in office) / [x] Appointment(In office/virtual)/ []  [facility]/ [] Home Care/ [] Refused Recommended Disposition /[]  Mobile Bus/ []  Follow-up with PCP Additional Notes: pt stated when 1st removed tick area looked normal: area has gradually become swollen, red, and no has some drainage.m   Reason for Disposition  [1] 2 to 14 days following tick bite AND [2] fever AND [3] no rash or headache  Answer Assessment - Initial Assessment Questions 1. ATTACHED:  "Is the tick still on the skin?"  (e.g., yes, no, unsure)     no 2. ONSET - TICK STILL ATTACHED:  "How long do you think the tick has been on your skin?" (e.g., hours, days, unsure)  Note:  Is there a recent activity (camping, hiking) where the caller may have been exposed?    N/a 3. ONSET - TICK NOT STILL ATTACHED: "If the tick has been removed, how long do you think the tick was attached before you removed it?" (e.g., 5 hours, 2 days). "When was this?"     X 2days 4. LOCATION: "Where is the tick bite located?" (e.g., arm, leg)     Left foot 5. TYPE of TICK: "Is it a wood tick or a deer tick?" (e.g., deer tick, wood tick; unsure)     Deer tick  6. SIZE of TICK: "How big is the tick?" (e.g., size of poppy seed, apple seed, watermelon seed; unsure) Note: Deer ticks can be the size of a poppy seed (nymph) or an apple seed (adult).       N/a 7. ENGORGED: "Did the tick look flat or engorged (full, swollen)?" (e.g., flat, engorged; unsure)     unsure 8. OTHER SYMPTOMS: "Do you have any other symptoms?" (e.g., fever, rash,  redness at bite area, red ring around bite)     Drainage, redness, swelling,  9. PREGNANCY: "Is there any chance you are pregnant?" "When was your last menstrual period?"     no  Protocols used: Tick Bite-A-AH

## 2024-01-10 NOTE — Patient Instructions (Signed)
 Start doxycycline 100 mg twice daily x 5 days.  Remember that you will be more sensitive to the sunlight when outdoors.  Please update me if no improvement.  It was a pleasure to see you today!

## 2024-01-10 NOTE — Telephone Encounter (Signed)
 Noted, will evaluate.

## 2024-01-10 NOTE — Progress Notes (Signed)
 Subjective:    Patient ID: Caitlin Molina, female    DOB: Apr 14, 1991, 33 y.o.   MRN: [id number]  HPI  Caitlin Molina is a very pleasant 33 y.o. female with a history of ADHD, hypothyroidism, rectal bleeding who presents today to discuss skin irritation.  She is also requesting a refill of her rectal suppositories.   She noticed a tick to the medial side of the second toe of the left foot two mornings ago. She removed the tick and has since noticed increased redness, warmth, and swelling at the tick site.   She denies pain but the swelling has altered her gait. She denies fevers, body aches, chills, headaches. She's concerned as the swelling and redness has spread from the site of the bite encroaching upon her foot.   Review of Systems  Constitutional:  Negative for chills and fever.  Musculoskeletal:  Negative for myalgias.  Skin:  Positive for color change and wound.  Neurological:  Negative for headaches.         Past Medical History:  Diagnosis Date   ADD (attention deficit disorder)    Anxiety    Herpes zoster without complication 10/18/2020   Hypothyroidism due to Hashimoto's thyroiditis    Vitamin D deficiency     Social History   Socioeconomic History   Marital status: Single    Spouse name: Not on file   Number of children: Not on file   Years of education: Not on file   Highest education level: Not on file  Occupational History   Not on file  Tobacco Use   Smoking status: Never   Smokeless tobacco: Never  Vaping Use   Vaping status: Never Used  Substance and Sexual Activity   Alcohol use: Yes    Comment: Socially   Drug use: No   Sexual activity: Not Currently    Birth control/protection: Pill  Other Topics Concern   Not on file  Social History Narrative   Single.   No children.   Works for [employer].   Social Drivers of Corporate investment banker Strain: Not on file  Food Insecurity: Not on file  Transportation Needs: Not on file   Physical Activity: Not on file  Stress: Not on file  Social Connections: Not on file  Intimate Partner Violence: Not on file    Past Surgical History:  Procedure Laterality Date   WISDOM TOOTH EXTRACTION  2013    Family History  Problem Relation Age of Onset   Mental illness Sister    Thyroid  disease Sister    Asthma Brother    Diabetes Mellitus I Brother    Heart attack Maternal Grandfather    Thyroid  disease Paternal Grandmother    Hypertension Paternal Grandmother    Breast cancer Paternal Aunt     Allergies  Allergen Reactions   Ciprofloxacin     Family allergy - pt never exposed   Levofloxacin     Family allergies    Current Outpatient Medications on File Prior to Visit  Medication Sig Dispense Refill   amphetamine -dextroamphetamine  (ADDERALL) 20 MG tablet Take 1 tablet (20 mg total) by mouth 2 (two) times daily.     levothyroxine  (SYNTHROID ) 25 MCG tablet TAKE 1 TABLET BY MOUTH EVERY MORNING ON AN EMPTY STOMACH WITH WATER ONLY. NO FOOD OR OTHER MEDICATIONS FOR 30 MINUTES. (Patient not taking: Reported on 01/10/2024) 90 tablet 0   No current facility-administered medications on file prior to visit.    BP 132/78  Pulse 90   Temp 98.4 F (36.9 C) (Temporal)   Ht 5\' 6"  (1.676 m)   Wt 163 lb (73.9 kg)   LMP 12/21/2023   SpO2 98%   BMI 26.31 kg/m  Objective:   Physical Exam Cardiovascular:     Rate and Rhythm: Normal rate and regular rhythm.  Pulmonary:     Effort: Pulmonary effort is normal.     Breath sounds: Normal breath sounds.  Musculoskeletal:     Cervical back: Neck supple.       Feet:  Feet:     Comments: Moderate erythema and swelling to left dorsal foot and left 2nd great toe with warmth. Pinpoint open wound. Skin:    General: Skin is warm and dry.     Findings: Erythema present.  Neurological:     Mental Status: She is alert and oriented to person, place, and time.           Assessment & Plan:  Cellulitis of left lower  extremity Assessment & Plan: No evidence of Lyme Disease, although it would be quite early, however she does have evidence of early cellulitis.   Start doxycycline 100 mg twice daily x 5 days. Return precautions provided.  Orders: -     Doxycycline Hyclate; Take 1 tablet (100 mg total) by mouth 2 (two) times daily.  Dispense: 10 tablet; Refill: 0  Rectal bleeding -     Hydrocortisone  Acetate; Place 1 suppository (25 mg total) rectally 2 (two) times daily.  Dispense: 12 suppository; Refill: 0        Gabriel John, NP

## 2024-01-14 DIAGNOSIS — L03116 Cellulitis of left lower limb: Secondary | ICD-10-CM

## 2024-01-14 MED ORDER — DOXYCYCLINE HYCLATE 100 MG PO TABS
100.0000 mg | ORAL_TABLET | Freq: Two times a day (BID) | ORAL | 0 refills | Status: AC
Start: 2024-01-14 — End: ?
